# Patient Record
Sex: Female | Born: 1965
Health system: Southern US, Community
[De-identification: ages and names within clinical notes are randomized; demographics above are authoritative.]

## PROBLEM LIST (undated history)

## (undated) DIAGNOSIS — Z8719 Personal history of other diseases of the digestive system: Secondary | ICD-10-CM

## (undated) DIAGNOSIS — F988 Other specified behavioral and emotional disorders with onset usually occurring in childhood and adolescence: Secondary | ICD-10-CM

## (undated) DIAGNOSIS — S83511A Sprain of anterior cruciate ligament of right knee, initial encounter: Secondary | ICD-10-CM

## (undated) DIAGNOSIS — Z973 Presence of spectacles and contact lenses: Secondary | ICD-10-CM

## (undated) HISTORY — PX: WISDOM TOOTH EXTRACTION: SHX21

## (undated) HISTORY — DX: Other specified behavioral and emotional disorders with onset usually occurring in childhood and adolescence: F98.8

---

## 2004-09-30 ENCOUNTER — Ambulatory Visit (HOSPITAL_COMMUNITY): Admission: RE | Admit: 2004-09-30 | Discharge: 2004-09-30 | Payer: Self-pay | Admitting: Family Medicine

## 2006-04-10 ENCOUNTER — Other Ambulatory Visit: Admission: RE | Admit: 2006-04-10 | Discharge: 2006-04-10 | Payer: Self-pay | Admitting: Family Medicine

## 2006-08-18 HISTORY — PX: SHOULDER SURGERY: SHX246

## 2007-04-08 ENCOUNTER — Ambulatory Visit (HOSPITAL_COMMUNITY): Admission: RE | Admit: 2007-04-08 | Discharge: 2007-04-08 | Payer: Self-pay | Admitting: Specialist

## 2007-06-08 ENCOUNTER — Ambulatory Visit (HOSPITAL_BASED_OUTPATIENT_CLINIC_OR_DEPARTMENT_OTHER): Admission: RE | Admit: 2007-06-08 | Discharge: 2007-06-08 | Payer: Self-pay | Admitting: Specialist

## 2007-06-08 HISTORY — PX: SHOULDER ARTHROSCOPY W/ LABRAL REPAIR: SHX2399

## 2007-07-26 ENCOUNTER — Encounter: Admission: RE | Admit: 2007-07-26 | Discharge: 2007-08-18 | Payer: Self-pay | Admitting: Specialist

## 2007-08-19 ENCOUNTER — Encounter: Admission: RE | Admit: 2007-08-19 | Discharge: 2007-09-20 | Payer: Self-pay | Admitting: Specialist

## 2009-05-10 ENCOUNTER — Emergency Department (HOSPITAL_COMMUNITY): Admission: EM | Admit: 2009-05-10 | Discharge: 2009-05-10 | Payer: Self-pay | Admitting: Family Medicine

## 2009-06-25 ENCOUNTER — Other Ambulatory Visit: Admission: RE | Admit: 2009-06-25 | Discharge: 2009-06-25 | Payer: Self-pay | Admitting: Family Medicine

## 2010-07-09 ENCOUNTER — Ambulatory Visit (HOSPITAL_COMMUNITY)
Admission: RE | Admit: 2010-07-09 | Discharge: 2010-07-09 | Payer: Self-pay | Source: Home / Self Care | Admitting: Family Medicine

## 2010-08-02 ENCOUNTER — Encounter
Admission: RE | Admit: 2010-08-02 | Discharge: 2010-08-02 | Payer: Self-pay | Source: Home / Self Care | Attending: Family Medicine | Admitting: Family Medicine

## 2010-09-08 ENCOUNTER — Encounter: Payer: Self-pay | Admitting: Family Medicine

## 2010-12-31 NOTE — Op Note (Signed)
Melissa Mejia, Melissa Mejia                 ACCOUNT NO.:  [id number]   MEDICAL RECORD NO.:  [id number]          PATIENT TYPE:  AMB   LOCATION:  [facility]                         FACILITY:  [facility]   PHYSICIAN:  Erasmo Leventhal, M.D.DATE OF BIRTH:  12/16/1965   DATE OF PROCEDURE:  06/08/2007  DATE OF DISCHARGE:                               OPERATIVE REPORT   PREOPERATIVE DIAGNOSIS:  Left shoulder posterior labral tear.   POSTOPERATIVE DIAGNOSIS:  Left shoulder posterior labral tear.   PROCEDURE:  Left shoulder arthroscopic posterior labral repair.   SURGEON:  Erasmo Leventhal, [name]   ASSISTANT:  Jaquelyn Bitter. Chabon, P.A.-C.   ANESTHESIA:  Interscalene block with general.   BLOOD LOSS:  Less than 10 mL.   DRAINS:  None.   COMPLICATIONS:  None.   DISPOSITION:  PACU, stable.   DESCRIPTION OF PROCEDURE:  The patient was counseled in the holding  area.  The correct site was identified.  IV was started, antibiotics  given, and block was administered.  She was then taken to the operating  room and placed in the supine position under general anesthesia.  She  was turned to right lateral decubitus position properly padded and bump.   The left shoulder was examined, having revealed full range of motion and  stable.  She was then prepped with DuraPrep, draped in sterile fashion.  Overhead shoulder positioner was utilized at 30 degrees abduction, 10  degrees forward flexion, and 10 pounds of longitudinal traction.  A  posterior portal was created and arthroscope placed into the  glenohumeral joint.  Diagnostic arthroscopy revealed normal anterior and  superior structures, including the synovium, capsule, rotator cuff,  biceps superior, anterior, and inferior labrum.  The articular cartilage  was normal.  Anterior, superior, middle, and inferior glenohumeral  ligaments were normal.  The shoulder was stable.  Posteriorly, there was  an obvious labral tear.  An anterior portal was created and  the  arthroscope placed anteriorly.  Looking posteriorly, a posterior portal  was then utilized also.  She was found to have an area of grade 2  chondromalacia, small area 5 x 5 mm on the posterior superior glenoid,  and there was obvious detachment of the posterior labrum but no evidence  of instability.  The shaver was introduced.  Some of the ragged edges  were shaved down and smoothed nicely.  The repair site was repaired down  to bleeding bone with a rasp.  FiberWire mattress sutures were then  placed into the posterior capsule and labrum, and then a drill hole was  performed, and a push lock anchor was utilized for mattress type repair  of the posterior inferior labrum.  In the mid-superior aspect, another  mattress suture was placed, push drill hole, push lock anchor.  When we  had two nice mattress sutures in here, the shoulder was stable.  The  labrum had been nicely repaired.  There were no complications or  problems.  A light mechanical  complex also performed on the  chondromalacia.  The shoulder joint was copiously irrigated.  No  abnormalities were  noted in the  subacromial region.  She was taken out  of traction.  The portals were closed with 4-0 nylon suture, and 10 mL  of 0.5% Marcaine with epinephrine placed in the portal sites and  shoulder joint.  Sterile dressing was applied.   She was then turned supine, awakened, and taken to the recovery room in  stable condition.  Sponge and needle counts were correct.  No  complications or problems.  Another gram of Ancef was given  intravenously at the end of the case.   SURGICAL DECISION MAKING AND TECHNIQUE:  Mr. Leilani Able, P.A.-C.  assisted in the entire case.  The patient will have stabilization in the  PACU and discharged home.           ______________________________  Erasmo Leventhal, [name]     RAC/MEDQ  D:  06/08/2007  T:  06/08/2007  Job:  [id number]

## 2011-05-28 LAB — POCT PREGNANCY, URINE: Operator id: 268271

## 2013-05-04 ENCOUNTER — Other Ambulatory Visit: Payer: Self-pay | Admitting: Family Medicine

## 2013-05-04 ENCOUNTER — Other Ambulatory Visit (HOSPITAL_COMMUNITY)
Admission: RE | Admit: 2013-05-04 | Discharge: 2013-05-04 | Disposition: A | Discharge status: Home / Self Care | Payer: 59 | Source: Ambulatory Visit | Attending: Family Medicine | Admitting: Family Medicine

## 2013-05-04 DIAGNOSIS — Z0000 Encounter for general adult medical examination without abnormal findings: Secondary | ICD-10-CM | POA: Insufficient documentation

## 2014-01-02 ENCOUNTER — Ambulatory Visit (INDEPENDENT_AMBULATORY_CARE_PROVIDER_SITE_OTHER): Payer: 59 | Admitting: Family Medicine

## 2014-01-02 ENCOUNTER — Encounter (INDEPENDENT_AMBULATORY_CARE_PROVIDER_SITE_OTHER): Payer: Self-pay

## 2014-01-02 ENCOUNTER — Encounter: Payer: Self-pay | Admitting: Family Medicine

## 2014-01-02 VITALS — BP 116/81 | HR 76 | Ht 65.0 in | Wt 160.0 lb

## 2014-01-02 DIAGNOSIS — S99929A Unspecified injury of unspecified foot, initial encounter: Secondary | ICD-10-CM

## 2014-01-02 DIAGNOSIS — S99919A Unspecified injury of unspecified ankle, initial encounter: Secondary | ICD-10-CM

## 2014-01-02 DIAGNOSIS — M25569 Pain in unspecified knee: Secondary | ICD-10-CM

## 2014-01-02 DIAGNOSIS — S8990XA Unspecified injury of unspecified lower leg, initial encounter: Secondary | ICD-10-CM

## 2014-01-02 DIAGNOSIS — M25562 Pain in left knee: Secondary | ICD-10-CM

## 2014-01-02 DIAGNOSIS — S8992XA Unspecified injury of left lower leg, initial encounter: Secondary | ICD-10-CM

## 2014-01-03 ENCOUNTER — Encounter: Payer: Self-pay | Admitting: Family Medicine

## 2014-01-03 ENCOUNTER — Ambulatory Visit (HOSPITAL_BASED_OUTPATIENT_CLINIC_OR_DEPARTMENT_OTHER)
Admission: RE | Admit: 2014-01-03 | Discharge: 2014-01-03 | Disposition: A | Discharge status: Home / Self Care | Payer: 59 | Source: Ambulatory Visit | Attending: Family Medicine | Admitting: Family Medicine

## 2014-01-03 DIAGNOSIS — M25562 Pain in left knee: Secondary | ICD-10-CM

## 2014-01-03 DIAGNOSIS — M25569 Pain in unspecified knee: Secondary | ICD-10-CM | POA: Insufficient documentation

## 2014-01-03 DIAGNOSIS — X58XXXA Exposure to other specified factors, initial encounter: Secondary | ICD-10-CM | POA: Insufficient documentation

## 2014-01-03 DIAGNOSIS — S83509A Sprain of unspecified cruciate ligament of unspecified knee, initial encounter: Secondary | ICD-10-CM | POA: Insufficient documentation

## 2014-01-03 DIAGNOSIS — IMO0002 Reserved for concepts with insufficient information to code with codable children: Secondary | ICD-10-CM | POA: Insufficient documentation

## 2014-01-03 DIAGNOSIS — S8992XA Unspecified injury of left lower leg, initial encounter: Secondary | ICD-10-CM | POA: Insufficient documentation

## 2014-01-03 NOTE — Progress Notes (Addendum)
Patient ID: Melissa Mejia, female   DOB: Dec 20, 1965, 48 y.o.   MRN: [id number]  PCP: Vidal Schwalbe, MD  Subjective:   HPI: Patient is a 48 y.o. female here for left knee injury.  Patient was doing a mud run this weekend. She states on 5/16 she was trying to climb an 8 foot wall as part of this and she fell backwards, planted left foot. Tried going up again, fell back again but twisted knee - did not hear a pop but was very uncomfortable. Continued with the race but knee almost gave out once. Saturday night using stairs also had feeling knee was going to give out. No catching, locking. + swelling. Prior MCL sprain a year ago to this knee.  History reviewed. No pertinent past medical history.  No current outpatient prescriptions on file prior to visit.   No current facility-administered medications on file prior to visit.    Past Surgical History  Procedure Laterality Date  . Shoulder surgery Left 2008    labrum repair    No Known Allergies  History   Social History  . Marital Status: Married    Spouse Name: N/A    Number of Children: N/A  . Years of Education: N/A   Occupational History  . Not on file.   Social History Main Topics  . Smoking status: Former Research scientist (life sciences)  . Smokeless tobacco: Not on file  . Alcohol Use: Not on file  . Drug Use: Not on file  . Sexual Activity: Not on file   Other Topics Concern  . Not on file   Social History Narrative  . No narrative on file    History reviewed. No pertinent family history.  BP 116/81  Pulse 76  Ht 5\' 5"  (1.651 m)  Wt 160 lb (72.576 kg)  BMI 26.63 kg/m2  Review of Systems: See HPI above.    Objective:  Physical Exam:  Gen: NAD  Left knee: Mild-mod effusion.  No other deformity, bruising. No TTP. ROM 0 - 110 degrees, tightness with full flexion. Positive anterior drawer, lachmanns.  Negative post drawers. Negative valgus/varus testing.  Negative mcmurrays, apleys, patellar apprehension. NV intact  distally.    Assessment & Plan:  1. Left knee injury - consistent with ACL tear.  Meniscus testing negative however.  Will go ahead with MRI to further assess.  Addendum:  MRI reviewed and discussed with patient.  She does have a complete ACL tear as expected.  No meniscal tear or other associated injuries.  She will see Dr. Theda Sers (ortho) to discuss ACL reconstruction.  Follow up with me prn.

## 2014-01-03 NOTE — Assessment & Plan Note (Signed)
consistent with ACL tear.  Meniscus testing negative however.  Will go ahead with MRI to further assess.

## 2014-02-03 ENCOUNTER — Encounter (HOSPITAL_BASED_OUTPATIENT_CLINIC_OR_DEPARTMENT_OTHER): Payer: Self-pay | Admitting: *Deleted

## 2014-02-06 ENCOUNTER — Other Ambulatory Visit: Payer: Self-pay | Admitting: Orthopedic Surgery

## 2014-02-09 ENCOUNTER — Ambulatory Visit (HOSPITAL_BASED_OUTPATIENT_CLINIC_OR_DEPARTMENT_OTHER): Payer: 59 | Admitting: Anesthesiology

## 2014-02-09 ENCOUNTER — Encounter (HOSPITAL_BASED_OUTPATIENT_CLINIC_OR_DEPARTMENT_OTHER): Payer: Self-pay | Admitting: *Deleted

## 2014-02-09 ENCOUNTER — Ambulatory Visit (HOSPITAL_BASED_OUTPATIENT_CLINIC_OR_DEPARTMENT_OTHER)
Admission: RE | Admit: 2014-02-09 | Discharge: 2014-02-09 | Disposition: A | Discharge status: Home / Self Care | Payer: 59 | Source: Ambulatory Visit | Attending: Specialist | Admitting: Specialist

## 2014-02-09 ENCOUNTER — Encounter (HOSPITAL_BASED_OUTPATIENT_CLINIC_OR_DEPARTMENT_OTHER): Payer: 59 | Admitting: Anesthesiology

## 2014-02-09 ENCOUNTER — Encounter (HOSPITAL_BASED_OUTPATIENT_CLINIC_OR_DEPARTMENT_OTHER)
Admission: RE | Disposition: A | Discharge status: Home / Self Care | Payer: Self-pay | Source: Ambulatory Visit | Attending: Specialist

## 2014-02-09 DIAGNOSIS — S83509A Sprain of unspecified cruciate ligament of unspecified knee, initial encounter: Secondary | ICD-10-CM | POA: Insufficient documentation

## 2014-02-09 DIAGNOSIS — Z9889 Other specified postprocedural states: Secondary | ICD-10-CM

## 2014-02-09 DIAGNOSIS — Z87891 Personal history of nicotine dependence: Secondary | ICD-10-CM | POA: Insufficient documentation

## 2014-02-09 DIAGNOSIS — W138XXA Fall from, out of or through other building or structure, initial encounter: Secondary | ICD-10-CM | POA: Insufficient documentation

## 2014-02-09 DIAGNOSIS — Z79899 Other long term (current) drug therapy: Secondary | ICD-10-CM | POA: Insufficient documentation

## 2014-02-09 HISTORY — PX: KNEE ARTHROSCOPY WITH ANTERIOR CRUCIATE LIGAMENT (ACL) REPAIR WITH HAMSTRING GRAFT: SHX5645

## 2014-02-09 HISTORY — DX: Personal history of other diseases of the digestive system: Z87.19

## 2014-02-09 HISTORY — DX: Presence of spectacles and contact lenses: Z97.3

## 2014-02-09 HISTORY — DX: Sprain of anterior cruciate ligament of right knee, initial encounter: S83.511A

## 2014-02-09 LAB — POCT PREGNANCY, URINE: Preg Test, Ur: NEGATIVE

## 2014-02-09 LAB — POCT HEMOGLOBIN-HEMACUE: HEMOGLOBIN: 12.6 g/dL (ref 12.0–15.0)

## 2014-02-09 SURGERY — KNEE ARTHROSCOPY WITH ANTERIOR CRUCIATE LIGAMENT (ACL) REPAIR WITH HAMSTRING GRAFT
Anesthesia: General | Site: Knee | Laterality: Left

## 2014-02-09 MED ORDER — MIDAZOLAM HCL 5 MG/5ML IJ SOLN
INTRAMUSCULAR | Status: DC | PRN
  Administered 2014-02-09: 2 mg via INTRAVENOUS

## 2014-02-09 MED ORDER — CEPHALEXIN 500 MG PO CAPS: 500.0000 mg | ORAL_CAPSULE | Freq: Three times a day (TID) | ORAL | Status: DC

## 2014-02-09 MED ORDER — ONDANSETRON HCL 4 MG/2ML IJ SOLN
INTRAMUSCULAR | Status: DC | PRN
  Administered 2014-02-09: 4 mg via INTRAVENOUS

## 2014-02-09 MED ORDER — MIDAZOLAM HCL 2 MG/2ML IJ SOLN
INTRAMUSCULAR | Status: AC
  Filled 2014-02-09: qty 2

## 2014-02-09 MED ORDER — FENTANYL CITRATE 0.05 MG/ML IJ SOLN
INTRAMUSCULAR | Status: DC | PRN
  Administered 2014-02-09 (×4): 50 ug via INTRAVENOUS

## 2014-02-09 MED ORDER — OXYCODONE HCL 5 MG/5ML PO SOLN
5.0000 mg | Freq: Once | ORAL | Status: AC | PRN
  Filled 2014-02-09: qty 5

## 2014-02-09 MED ORDER — HYDROMORPHONE HCL PF 1 MG/ML IJ SOLN
INTRAMUSCULAR | Status: AC
  Filled 2014-02-09: qty 1

## 2014-02-09 MED ORDER — PROMETHAZINE HCL 25 MG/ML IJ SOLN
6.2500 mg | INTRAMUSCULAR | Status: DC | PRN
  Filled 2014-02-09: qty 1

## 2014-02-09 MED ORDER — METHOCARBAMOL 500 MG PO TABS: 500.0000 mg | ORAL_TABLET | Freq: Four times a day (QID) | ORAL | Status: DC | PRN

## 2014-02-09 MED ORDER — CEFAZOLIN SODIUM-DEXTROSE 2-3 GM-% IV SOLR
2.0000 g | INTRAVENOUS | Status: AC
  Administered 2014-02-09: 2 g via INTRAVENOUS
  Filled 2014-02-09: qty 50

## 2014-02-09 MED ORDER — FENTANYL CITRATE 0.05 MG/ML IJ SOLN
INTRAMUSCULAR | Status: AC
  Filled 2014-02-09: qty 2

## 2014-02-09 MED ORDER — POVIDONE-IODINE 7.5 % EX SOLN
Freq: Once | CUTANEOUS | Status: DC
  Filled 2014-02-09: qty 118

## 2014-02-09 MED ORDER — SODIUM CHLORIDE 0.9 % IR SOLN
Status: DC | PRN
  Administered 2014-02-09: 18000 mL

## 2014-02-09 MED ORDER — ASPIRIN EC 325 MG PO TBEC: 325.0000 mg | DELAYED_RELEASE_TABLET | Freq: Two times a day (BID) | ORAL | Status: DC

## 2014-02-09 MED ORDER — FENTANYL CITRATE 0.05 MG/ML IJ SOLN
100.0000 ug | Freq: Once | INTRAMUSCULAR | Status: AC
  Administered 2014-02-09: 100 ug via INTRAVENOUS
  Filled 2014-02-09: qty 2

## 2014-02-09 MED ORDER — ACETAMINOPHEN 10 MG/ML IV SOLN
INTRAVENOUS | Status: DC | PRN
  Administered 2014-02-09: 1000 mg via INTRAVENOUS

## 2014-02-09 MED ORDER — OXYCODONE HCL 5 MG PO TABS
5.0000 mg | ORAL_TABLET | Freq: Once | ORAL | Status: AC | PRN
  Administered 2014-02-09: 5 mg via ORAL
  Filled 2014-02-09: qty 1

## 2014-02-09 MED ORDER — MIDAZOLAM HCL 2 MG/2ML IJ SOLN
2.0000 mg | Freq: Once | INTRAMUSCULAR | Status: AC
  Administered 2014-02-09: 2 mg via INTRAVENOUS
  Filled 2014-02-09: qty 2

## 2014-02-09 MED ORDER — SCOPOLAMINE 1 MG/3DAYS TD PT72
MEDICATED_PATCH | TRANSDERMAL | Status: DC | PRN
  Administered 2014-02-09: 1 via TRANSDERMAL

## 2014-02-09 MED ORDER — DEXAMETHASONE SODIUM PHOSPHATE 4 MG/ML IJ SOLN
INTRAMUSCULAR | Status: DC | PRN
  Administered 2014-02-09: 10 mg via INTRAVENOUS

## 2014-02-09 MED ORDER — BUPIVACAINE HCL (PF) 0.25 % IJ SOLN
INTRAMUSCULAR | Status: DC | PRN
  Administered 2014-02-09: 30 mL

## 2014-02-09 MED ORDER — MORPHINE SULFATE 4 MG/ML IJ SOLN
INTRAMUSCULAR | Status: AC
  Filled 2014-02-09: qty 1

## 2014-02-09 MED ORDER — LIDOCAINE HCL (CARDIAC) 20 MG/ML IV SOLN
INTRAVENOUS | Status: DC | PRN
  Administered 2014-02-09: 70 mg via INTRAVENOUS

## 2014-02-09 MED ORDER — OXYCODONE HCL 5 MG PO TABS
ORAL_TABLET | ORAL | Status: AC
  Filled 2014-02-09: qty 1

## 2014-02-09 MED ORDER — OXYCODONE-ACETAMINOPHEN 5-325 MG PO TABS: 1.0000 | ORAL_TABLET | ORAL | Status: DC | PRN

## 2014-02-09 MED ORDER — FENTANYL CITRATE 0.05 MG/ML IJ SOLN
INTRAMUSCULAR | Status: AC
  Filled 2014-02-09: qty 4

## 2014-02-09 MED ORDER — ROPIVACAINE HCL 5 MG/ML IJ SOLN
INTRAMUSCULAR | Status: DC | PRN
  Administered 2014-02-09: 30 mL via PERINEURAL

## 2014-02-09 MED ORDER — LACTATED RINGERS IV SOLN
INTRAVENOUS | Status: DC | PRN
  Administered 2014-02-09 (×2): via INTRAVENOUS

## 2014-02-09 MED ORDER — MEPERIDINE HCL 25 MG/ML IJ SOLN
6.2500 mg | INTRAMUSCULAR | Status: DC | PRN
  Filled 2014-02-09: qty 1

## 2014-02-09 MED ORDER — HYDROMORPHONE HCL PF 1 MG/ML IJ SOLN
0.2500 mg | INTRAMUSCULAR | Status: DC | PRN
  Administered 2014-02-09: 0.25 mg via INTRAVENOUS
  Filled 2014-02-09: qty 1

## 2014-02-09 MED ORDER — MORPHINE SULFATE 4 MG/ML IJ SOLN
INTRAMUSCULAR | Status: DC | PRN
  Administered 2014-02-09: 4 mg via INTRAMUSCULAR

## 2014-02-09 MED ORDER — SODIUM CHLORIDE 0.9 % IV SOLN
INTRAVENOUS | Status: DC
  Filled 2014-02-09: qty 1000

## 2014-02-09 MED ORDER — LACTATED RINGERS IV SOLN
INTRAVENOUS | Status: DC
  Administered 2014-02-09 (×2): via INTRAVENOUS
  Filled 2014-02-09: qty 1000

## 2014-02-09 MED ORDER — PROPOFOL 10 MG/ML IV BOLUS
INTRAVENOUS | Status: DC | PRN
  Administered 2014-02-09: 150 mg via INTRAVENOUS

## 2014-02-09 SURGICAL SUPPLY — 85 items
ANCHOR BUTTON TIGHTROPE BTB (Anchor) ×3 IMPLANT
ANCHOR BUTTON TIGHTROPE RN 14 (Anchor) ×3 IMPLANT
ANCHOR PUSHLOCK BIOCOMP 3.5X19 (Orthopedic Implant) ×3 IMPLANT
BANDAGE ESMARK 6X9 LF (GAUZE/BANDAGES/DRESSINGS) ×1 IMPLANT
BLADE 4.2CUDA (BLADE) IMPLANT
BLADE CUDA 5.5 (BLADE) ×3 IMPLANT
BLADE CUDA GRT WHITE 3.5 (BLADE) IMPLANT
BLADE SURG 10 STRL SS (BLADE) ×3 IMPLANT
BLADE SURG 15 STRL LF DISP TIS (BLADE) ×1 IMPLANT
BLADE SURG 15 STRL SS (BLADE) ×2
BNDG ESMARK 6X9 LF (GAUZE/BANDAGES/DRESSINGS) ×3
BNDG GAUZE ELAST 4 BULKY (GAUZE/BANDAGES/DRESSINGS) ×6 IMPLANT
BUR OVAL 6.0 (BURR) IMPLANT
BUR VERTEX HOODED 4.5 (BURR) ×3 IMPLANT
CANISTER SUCT LVC 12 LTR MEDI- (MISCELLANEOUS) ×9 IMPLANT
CANISTER SUCTION 1200CC (MISCELLANEOUS) IMPLANT
CANISTER SUCTION 2500CC (MISCELLANEOUS) ×6 IMPLANT
CLOSURE WOUND 1/2 X4 (GAUZE/BANDAGES/DRESSINGS) ×1
CLOTH BEACON ORANGE TIMEOUT ST (SAFETY) IMPLANT
COVER TABLE BACK 60X90 (DRAPES) ×3 IMPLANT
CUFF TOURN SGL QUICK 34 (TOURNIQUET CUFF) ×2
CUFF TRNQT CYL 34X4X40X1 (TOURNIQUET CUFF) ×1 IMPLANT
CUTTER FLIP II 9.5MM (INSTRUMENTS) ×3 IMPLANT
DRAPE ARTHROSCOPY W/POUCH 114 (DRAPES) ×3 IMPLANT
DRAPE INCISE IOBAN 66X45 STRL (DRAPES) ×3 IMPLANT
DRAPE LG THREE QUARTER DISP (DRAPES) ×9 IMPLANT
DRAPE OEC MINIVIEW 54X84 (DRAPES) ×3 IMPLANT
DRAPE POUCH INSTRU U-SHP 10X18 (DRAPES) ×3 IMPLANT
DRAPE U-SHAPE 47X51 STRL (DRAPES) ×3 IMPLANT
DURAPREP 26ML APPLICATOR (WOUND CARE) ×6 IMPLANT
ELECT REM PT RETURN 9FT ADLT (ELECTROSURGICAL) ×3
ELECTRODE REM PT RTRN 9FT ADLT (ELECTROSURGICAL) ×1 IMPLANT
FIBERSTICK 2 (SUTURE) ×3 IMPLANT
GAUZE XEROFORM 1X8 LF (GAUZE/BANDAGES/DRESSINGS) ×3 IMPLANT
GLOVE BIO SURGEON STRL SZ7.5 (GLOVE) ×3 IMPLANT
GLOVE BIOGEL PI IND STRL 7.5 (GLOVE) ×2 IMPLANT
GLOVE BIOGEL PI INDICATOR 7.5 (GLOVE) ×4
GLOVE INDICATOR 8.0 STRL GRN (GLOVE) ×6 IMPLANT
GLOVE SURG ORTHO 8.0 STRL STRW (GLOVE) ×3 IMPLANT
GLOVE SURG SS PI 7.5 STRL IVOR (GLOVE) ×3 IMPLANT
GOWN PREVENTION PLUS LG XLONG (DISPOSABLE) IMPLANT
GOWN STRL REIN XL XLG (GOWN DISPOSABLE) IMPLANT
GOWN STRL REUS W/TWL LRG LVL3 (GOWN DISPOSABLE) ×3 IMPLANT
GOWN STRL REUS W/TWL XL LVL3 (GOWN DISPOSABLE) ×9 IMPLANT
IMMOBILIZER KNEE 22 UNIV (SOFTGOODS) IMPLANT
IV NS IRRIG 3000ML ARTHROMATIC (IV SOLUTION) ×18 IMPLANT
KIT IMPLANT TIGHTROPE ABS OPEN (Anchor) ×3 IMPLANT
KIT TRANSTIBIAL (DISPOSABLE) IMPLANT
KNEE WRAP E Z 3 GEL PACK (MISCELLANEOUS) ×3 IMPLANT
MINI VAC (SURGICAL WAND) IMPLANT
NEEDLE HYPO 22GX1.5 SAFETY (NEEDLE) ×3 IMPLANT
NS IRRIG 1000ML POUR BTL (IV SOLUTION) ×3 IMPLANT
PACK ARTHROSCOPY DSU (CUSTOM PROCEDURE TRAY) ×3 IMPLANT
PACK BASIN DAY SURGERY FS (CUSTOM PROCEDURE TRAY) ×3 IMPLANT
PAD ABD 8X10 STRL (GAUZE/BANDAGES/DRESSINGS) ×6 IMPLANT
PADDING CAST ABS 4INX4YD NS (CAST SUPPLIES)
PADDING CAST ABS COTTON 4X4 ST (CAST SUPPLIES) IMPLANT
PENCIL BUTTON HOLSTER BLD 10FT (ELECTRODE) ×3 IMPLANT
SET ARTHROSCOPY TUBING (MISCELLANEOUS) ×2
SET ARTHROSCOPY TUBING LN (MISCELLANEOUS) ×1 IMPLANT
SET PAD KNEE POSITIONER (MISCELLANEOUS) ×3 IMPLANT
SPONGE GAUZE 4X4 12PLY (GAUZE/BANDAGES/DRESSINGS) ×6 IMPLANT
SPONGE LAP 4X18 X RAY DECT (DISPOSABLE) ×3 IMPLANT
STRIP CLOSURE SKIN 1/2X4 (GAUZE/BANDAGES/DRESSINGS) ×2 IMPLANT
SUCTION FRAZIER TIP 10 FR DISP (SUCTIONS) ×3 IMPLANT
SUT 2 FIBERLOOP 20 STRT BLUE (SUTURE) ×3
SUT ETHILON 4 0 PS 2 18 (SUTURE) ×6 IMPLANT
SUT FIBERWIRE #2 38 T-5 BLUE (SUTURE)
SUT MNCRL AB 3-0 PS2 18 (SUTURE) IMPLANT
SUT VIC AB 0 CT1 36 (SUTURE) ×9 IMPLANT
SUT VIC AB 0 CT2 27 (SUTURE) IMPLANT
SUT VIC AB 2-0 CT1 27 (SUTURE)
SUT VIC AB 2-0 CT1 TAPERPNT 27 (SUTURE) IMPLANT
SUTURE 2 FIBERLOOP 20 STRT BLU (SUTURE) ×1 IMPLANT
SUTURE FIBERWR #2 38 T-5 BLUE (SUTURE) IMPLANT
SUTURE TIGERSTICK 2 TIGERWIR 2 (MISCELLANEOUS) IMPLANT
SYR CONTROL 10ML LL (SYRINGE) ×3 IMPLANT
TIGERSTICK 2 TIGERWIRE 2 (MISCELLANEOUS)
TISSUE GRAFTLINK FGL (Tissue) ×3 IMPLANT
TOWEL OR 17X24 6PK STRL BLUE (TOWEL DISPOSABLE) ×9 IMPLANT
TUBE CONNECTING 12'X1/4 (SUCTIONS) ×2
TUBE CONNECTING 12X1/4 (SUCTIONS) ×4 IMPLANT
WAND 30 DEG SABER W/CORD (SURGICAL WAND) IMPLANT
WAND 90 DEG TURBOVAC W/CORD (SURGICAL WAND) IMPLANT
WATER STERILE IRR 500ML POUR (IV SOLUTION) ×3 IMPLANT

## 2014-02-09 NOTE — Anesthesia Preprocedure Evaluation (Addendum)
Anesthesia Evaluation  Patient identified by MRN, date of birth, ID band Patient awake    Reviewed: Allergy & Precautions, H&P , NPO status , Patient's Chart, lab work & pertinent test results  Airway Mallampati: II TM Distance: >3 FB Neck ROM: Full    Dental no notable dental hx.    Pulmonary neg pulmonary ROS, former smoker,  breath sounds clear to auscultation  Pulmonary exam normal       Cardiovascular Exercise Tolerance: Good negative cardio ROS  Rhythm:Regular Rate:Normal     Neuro/Psych negative neurological ROS  negative psych ROS   GI/Hepatic negative GI ROS, Neg liver ROS,   Endo/Other  negative endocrine ROS  Renal/GU negative Renal ROS  negative genitourinary   Musculoskeletal negative musculoskeletal ROS (+)   Abdominal   Peds negative pediatric ROS (+)  Hematology negative hematology ROS (+)   Anesthesia Other Findings   Reproductive/Obstetrics negative OB ROS                          Anesthesia Physical Anesthesia Plan  ASA: II  Anesthesia Plan: General   Post-op Pain Management:    Induction: Intravenous  Airway Management Planned: LMA  Additional Equipment:   Intra-op Plan:   Post-operative Plan: Extubation in OR  Informed Consent: I have reviewed the patients History and Physical, chart, labs and discussed the procedure including the risks, benefits and alternatives for the proposed anesthesia with the patient or authorized representative who has indicated his/her understanding and acceptance.   Dental advisory given  Plan Discussed with: CRNA and Anesthesiologist  Anesthesia Plan Comments:        Anesthesia Quick Evaluation

## 2014-02-09 NOTE — Anesthesia Procedure Notes (Addendum)
Anesthesia Regional Block:  Femoral nerve block  Pre-Anesthetic Checklist: ,, timeout performed, Correct Patient, Correct Site, Correct Laterality, Correct Procedure, Correct Position, site marked, Risks and benefits discussed,  Surgical consent,  Pre-op evaluation,  At surgeon's request and post-op pain management  Laterality: Left  Prep: chloraprep       Needles:  Injection technique: Single-shot  Needle Type: Stimulator Needle - 80     Needle Length: 9cm 9 cm Needle Gauge: 22 and 22 G  Needle insertion depth: 6 cm   Additional Needles:  Procedures: ultrasound guided (picture in chart) and nerve stimulator Femoral nerve block  Nerve Stimulator or Paresthesia:  Response: Twitch elicited, 0.6 mA,   Additional Responses:   Narrative:  Injection made incrementally with aspirations every 5 mL.  Performed by: Personally  Anesthesiologist: Lissa Hoard, MD  Additional Notes: BP cuff, EKG monitors applied. Sedation begun. Femoral artery palpated for location of nerve. After nerve location via u/s and anesthetic injected incrementally, slowly , and after neg aspirations. Tolerated well.   Procedure Name: LMA Insertion Date/Time: 02/09/2014 3:35 PM Performed by: Wanita Chamberlain Pre-anesthesia Checklist: Patient identified, Timeout performed, Emergency Drugs available, Suction available and Patient being monitored Oxygen Delivery Method: Circle system utilized Preoxygenation: Pre-oxygenation with 100% oxygen Intubation Type: IV induction Ventilation: Mask ventilation without difficulty LMA: LMA inserted LMA Size: 4.0 Number of attempts: 1 Placement Confirmation: breath sounds checked- equal and bilateral and positive ETCO2 Tube secured with: Tape

## 2014-02-09 NOTE — H&P (Signed)
Melissa Mejia is an 48 y.o. female.   Chief Complaint: Left knee pain and instabilty for a month HPI: Patient presents with left knee discomfort that had been persistent for a month now. Related to falling from a 8 foot wall. Despite conservative treatments, her discomfort has not improved. Imaging was obtained. Other conservative and surgical treatments were discussed in detail. Patient wishes to proceed with surgery as consented. Denies SOB, CP, or calf pain. No Fever, chills, or nausea/ vomiting.   Past Medical History  Diagnosis Date  . Right ACL tear   . History of esophageal reflux   . Wears contact lenses     Past Surgical History  Procedure Laterality Date  . Shoulder surgery Left 2008    labrum repair  . Wisdom tooth extraction      History reviewed. No pertinent family history. Social History:  reports that she quit smoking about 21 years ago. Her smoking use included Cigarettes. She smoked 0.00 packs per day for 4 years. She has never used smokeless tobacco. She reports that she drinks about 1.8 ounces of alcohol per week. She reports that she does not use illicit drugs.  Allergies: No Known Allergies  Medications Prior to Admission  Medication Sig Dispense Refill  . Green Tea, Camillia sinensis, (GREEN TEA EXTRACT PO) Take by mouth.      . naproxen sodium (ANAPROX) 220 MG tablet Take 220 mg by mouth as needed.      . Omega-3 Fatty Acids (FISH OIL PO) Take by mouth.        Results for orders placed during the hospital encounter of 02/09/14 (from the past 48 hour(s))  POCT HEMOGLOBIN-HEMACUE     Status: None   Collection Time    02/09/14 11:43 AM      Result Value Ref Range   Hemoglobin 12.6  12.0 - 15.0 g/dL  POCT PREGNANCY, URINE     Status: None   Collection Time    02/09/14 12:15 PM      Result Value Ref Range   Preg Test, Ur NEGATIVE  NEGATIVE   Comment:            THE SENSITIVITY OF THIS     METHODOLOGY IS >24 mIU/mL   No results found.  Review of  Systems  Constitutional: Negative.   HENT: Negative.   Eyes: Negative.   Respiratory: Negative.   Cardiovascular: Negative.   Gastrointestinal: Negative.   Genitourinary: Negative.   Musculoskeletal: Positive for joint pain.  Skin: Negative.   Neurological: Negative.   Endo/Heme/Allergies: Negative.   Psychiatric/Behavioral: Negative.     Blood pressure 118/84, pulse 64, temperature 98.6 F (37 C), temperature source Oral, resp. rate 15, height 5\' 5"  (1.651 m), weight 73.029 kg (161 lb), last menstrual period 02/02/2014, SpO2 99.00%. Physical Exam  Constitutional: She is oriented to person, place, and time. She appears well-developed.  HENT:  Head: Normocephalic.  Eyes: EOM are normal.  Neck: Normal range of motion.  Cardiovascular: Normal rate, regular rhythm, normal heart sounds and intact distal pulses.   Respiratory: Effort normal and breath sounds normal.  GI: Soft. Bowel sounds are normal.  Genitourinary:  deferred  Musculoskeletal: She exhibits edema.  Left knee instabilty  Neurological: She is alert and oriented to person, place, and time.  Skin: Skin is warm and dry.  Psychiatric: Her behavior is normal.     Assessment/Plan Left ACL tear and possible mensicus tear: Left ACL reconstruction allograft and possible menisectomy D/c home today F/u  in office in 3-4 days Follow D/c instructions Take medications as directed  STILWELL, BRYSON L 02/09/2014, 3:12 PM

## 2014-02-09 NOTE — Discharge Instructions (Signed)
°  Post Anesthesia Home Care Instructions  Activity: Get plenty of rest for the remainder of the day. A responsible adult should stay with you for 24 hours following the procedure.  For the next 24 hours, DO NOT: -Drive a car -Paediatric nurse -Drink alcoholic beverages -Take any medication unless instructed by your physician -Make any legal decisions or sign important papers.  Meals: Start with liquid foods such as gelatin or soup. Progress to regular foods as tolerated. Avoid greasy, spicy, heavy foods. If nausea and/or vomiting occur, drink only clear liquids until the nausea and/or vomiting subsides. Call your physician if vomiting continues.  Special Instructions/Symptoms: Your throat may feel dry or sore from the anesthesia or the breathing tube placed in your throat during surgery. If this causes discomfort, gargle with warm salt water. The discomfort should disappear within 24 hours.  Post Anesthesia Home Care Instructions  Activity: Get plenty of rest for the remainder of the day. A responsible adult should stay with you for 24 hours following the procedure.  For the next 24 hours, DO NOT: -Drive a car -Paediatric nurse -Drink alcoholic beverages -Take any medication unless instructed by your physician -Make any legal decisions or sign important papers.  Meals: Start with liquid foods such as gelatin or soup. Progress to regular foods as tolerated. Avoid greasy, spicy, heavy foods. If nausea and/or vomiting occur, drink only clear liquids until the nausea and/or vomiting subsides. Call your physician if vomiting continues.  Special Instructions/Symptoms: Your throat may feel dry or sore from the anesthesia or the breathing tube placed in your throat during surgery. If this causes discomfort, gargle with warm salt water. The discomfort should disappear within 24 hours. Regional Anesthesia Blocks  1. Numbness or the inability to move the "blocked" extremity may last from  3-48 hours after placement. The length of time depends on the medication injected and your individual response to the medication. If the numbness is not going away after 48 hours, call your surgeon.  2. The extremity that is blocked will need to be protected until the numbness is gone and the  Strength has returned. Because you cannot feel it, you will need to take extra care to avoid injury. Because it may be weak, you may have difficulty moving it or using it. You may not know what position it is in without looking at it while the block is in effect.  3. For blocks in the legs and feet, returning to weight bearing and walking needs to be done carefully. You will need to wait until the numbness is entirely gone and the strength has returned. You should be able to move your leg and foot normally before you try and bear weight or walk. You will need someone to be with you when you first try to ensure you do not fall and possibly risk injury.  4. Bruising and tenderness at the needle site are common side effects and will resolve in a few days.  5. Persistent numbness or new problems with movement should be communicated to the surgeon or the [place] [phone] [place] [phone]).

## 2014-02-09 NOTE — Op Note (Signed)
MMNOTRRN#[id number]

## 2014-02-09 NOTE — Transfer of Care (Signed)
Immediate Anesthesia Transfer of Care Note  Patient: Melissa Mejia  Procedure(s) Performed: Procedure(s): LEFT KNEE ARTHROSCOPY WITH ALLOGRAFT ANTERIOR CRUCIATE LIGAMENT (ACL) HAMSTRING RECONSTRUCTION POSSIBLE  MENISCECTOMY  (Left)  Patient Location: PACU  Anesthesia Type:General  Level of Consciousness: awake, alert , oriented and patient cooperative  Airway & Oxygen Therapy: Patient Spontanous Breathing and Patient connected to nasal cannula oxygen  Post-op Assessment: Report given to PACU RN and Post -op Vital signs reviewed and stable  Post vital signs: Reviewed and stable  Complications: No apparent anesthesia complications

## 2014-02-09 NOTE — Anesthesia Postprocedure Evaluation (Signed)
  Anesthesia Post-op Note  Patient: Melissa Mejia  Procedure(s) Performed: Procedure(s) (LRB): LEFT KNEE ARTHROSCOPY WITH ALLOGRAFT ANTERIOR CRUCIATE LIGAMENT (ACL) HAMSTRING RECONSTRUCTION  (Left)  Patient Location: PACU  Anesthesia Type: GA combined with regional for post-op pain  Level of Consciousness: awake and alert   Airway and Oxygen Therapy: Patient Spontanous Breathing  Post-op Pain: mild  Post-op Assessment: Post-op Vital signs reviewed, Patient's Cardiovascular Status Stable, Respiratory Function Stable, Patent Airway and No signs of Nausea or vomiting  Last Vitals:  Filed Vitals:   02/09/14 1800  BP: 118/80  Pulse: 58  Temp:   Resp: 11    Post-op Vital Signs: stable   Complications: No apparent anesthesia complications

## 2014-02-10 NOTE — Op Note (Signed)
Melissa Mejia, Melissa Mejia NO.:  [id number]  MEDICAL RECORD NO.:  [id number]  LOCATION:                                 FACILITY:  PHYSICIAN:  [name], M.D.DATE OF BIRTH:  November 19, 1965  DATE OF PROCEDURE:  02/09/2014 DATE OF DISCHARGE:  02/09/2014                              OPERATIVE REPORT   PREOPERATIVE DIAGNOSIS:  Left knee torn anterior cruciate ligament, possible torn meniscus.  POSTOPERATIVE DIAGNOSES: 1. Left knee complete rupture of anterior cruciate ligament. 2. Chondral defect, medial femoral condyle, 1.5 x 1 cm, grade 3.  PROCEDURES: 1. Left knee arthroscopic-assisted allograft anterior cruciate     ligament reconstruction. 2. Chondroplasty, medial femoral condyle.  SURGEON:  [name], [name]  ASSISTANT:  Wyatt Portela, PA-C.  ANESTHESIA:  Regional with general.  ESTIMATED BLOOD LOSS:  Minimal.  DRAINS:  None.  COMPLICATIONS:  None.  TOURNIQUET TIME:  65 minutes at 250 mmHg.  DISPOSITION:  PACU, stable.  OPERATIVE DETAILS:  The patient was counseled in the holding area. Correct site was identified.  IV had been started.  IV sedation was given and block was administered.  Correct site was marked.  TED hose was applied on uninvolved leg.  On the way to the operating room, IV Ancef was given.  In the OR, placed in supine position under general anesthesia.  All extremities were well padded and bumped.  Left lower extremity was elevated.  Procedure was prepped with DuraPrep, and draped in a sterile fashion.  A time-out was done and confirmed the left lower extremity.  Exsanguinated with an Esmarch.  Tourniquet was inflated to 250 mmHg.  Arthroscopic portals were established; posteromedial, inferomedial, and inferolateral.  Diagnostic arthroscopy revealed complete rupture of the anterior cruciate ligament off the femoral attachment.  Used motorized shaver, notchplasty was performed, and PCL was protected.  An 80 size  was appropriate, likely an Arthrex allograft had been chosen, brought to the back table per Mr. Wyatt Portela, PA- C.  Patellofemoral joint with normal tracking.  Lateral compartment was __________ lateral meniscus.  Medial side was inspected.  The medial meniscal was intact.  Medial tibial plateau unremarkable with the medial femoral condyle, showed a 1.5 x 1 cm area of grade 3 chondromalacia, unstable for __________.  Mechanical chondroplasty was performed with a shaver and back to stable base.  The Arthrex __________ to the overtop position.  Small stab would was made laterally and Arthrex FlipCutter was placed to the anatomic ACL footprint and retrosocket was performed. FiberWire suture was passed in place.  Tunnel was rasped and debrided. On incision site, the guide was put into the anatomic ACL footprint on the tibia.  A small incision was made inferomedially and an Arthrex FlipCutter was then placed in anatomic ACL footprint and retrosocket was performed.  FiberWire suture was passed in place.  Debris was removed. The graft was delivered to the knee joint.  The femoral graft was delivered in a retrograde fashion into the femoral socket.  We used ACL TightRope system, visualized, but arthroscopically palpable visible radiographically that the periosteum was on the femoral cortex.  We then delivered the graft into the knee joint.  On the tibial side, it was placed into the tibial socket in an antegrade fashion, 30 degrees of flexion and neutral rotation.  __________ tightened down nicely.  The graft had excellent orientation and tension.  __________ with a button and then PushLock anchor.  We put the knee through range of motion several times, the knee was clinically stable.  Arthroscopically, the graft with excellent orientation and tension.  Radiographically, the button was in excellent position.  Arthroscopic equipment was removed. The portal was closed with 4-0 nylon suture.   Anterior incision was closed with subcu Vicryl, the skin was closed with nylon.  Sterile dressing was applied to the knee after placement of 10 mL of Sensorcaine and 4 mL of morphine sulfate to the knee joint.  Tourniquet was deflated.  Normal circulation to foot and ankle at the end of the case. A TED hose was applied followed by TROM brace and ice pack in full extension.  She tolerated the procedure well.  There were no complications or problems.  She was awakened.  She was taken from the operating room to PACU in stable condition.  She will be stabilized in PACU and discharged to home.  To help with the patient positioning, prepping and draping, technical and surgical assistance throughout the entire case, graft preparation, wound closure, application of dressing and sling, Mr. Wyatt Portela, PA-C's assistance was needed.          ______________________________ [name], [name]     RAC/MEDQ  D:  02/09/2014  T:  02/10/2014  Job:  [id number]

## 2014-02-10 NOTE — Op Note (Deleted)
Melissa Mejia, Melissa Mejia NO.:  [id number]  MEDICAL RECORD NO.:  [id number]  LOCATION:                                 FACILITY:  PHYSICIAN:  [name], M.D.DATE OF BIRTH:  1966/05/05  DATE OF PROCEDURE:  02/09/2014 DATE OF DISCHARGE:  02/09/2014                              OPERATIVE REPORT   PREOPERATIVE DIAGNOSIS:  Left knee torn anterior cruciate ligament, possible torn meniscus.  POSTOPERATIVE DIAGNOSES: 1. Left knee complete rupture of anterior cruciate ligament. 2. Chondral defect, medial femoral condyle, 1.5 x 1 cm, grade 3.  PROCEDURES: 1. Left knee arthroscopic-assisted allograft anterior cruciate     ligament reconstruction. 2. Chondroplasty, medial femoral condyle.  SURGEON:  [name], [name]  ASSISTANT:  Wyatt Portela, PA-C.  ANESTHESIA:  Regional with general.  ESTIMATED BLOOD LOSS:  Minimal.  DRAINS:  None.  COMPLICATIONS:  None.  TOURNIQUET TIME:  65 minutes at 250 mmHg.  DISPOSITION:  PACU, stable.  OPERATIVE DETAILS:  The patient was counseled in the holding area. Correct site was identified.  IV had been started.  IV sedation was given and block was administered.  Correct site was marked.  TED hose was applied on uninvolved leg.  On the way to the operating room, IV Ancef was given.  In the OR, placed in supine position under general anesthesia.  All extremities were well padded and bumped.  Left lower extremity was elevated.  Procedure was prepped with DuraPrep, and draped in a sterile fashion.  A time-out was done and confirmed the left lower extremity.  Exsanguinated with an Esmarch.  Tourniquet was inflated to 250 mmHg.  Arthroscopic portals were established; posteromedial, inferomedial, and inferolateral.  Diagnostic arthroscopy revealed complete rupture of the anterior cruciate ligament off the femoral attachment.  Used motorized shaver, notchplasty was performed, and PCL was protected.  An 80 size  was appropriate, likely an Arthrex allograft had been chosen, brought to the back table per Mr. Wyatt Portela, PA- C.  Patellofemoral joint with normal tracking.  Lateral compartment was __________ lateral meniscus.  Medial side was inspected.  The medial meniscal was intact.  Medial tibial plateau unremarkable with the medial femoral condyle, showed a 1.5 x 1 cm area of grade 3 chondromalacia, unstable for __________.  Mechanical chondroplasty was performed with a shaver and back to stable base.  The Arthrex __________ to the overtop position.  Small stab would was made laterally and Arthrex FlipCutter was placed to the anatomic ACL footprint and retrosocket was performed. FiberWire suture was passed in place.  Tunnel was rasped and debrided. On incision site, the guide was put into the anatomic ACL footprint on the tibia.  A small incision was made inferomedially and an Arthrex FlipCutter was then placed in anatomic ACL footprint and retrosocket was performed.  FiberWire suture was passed in place.  Debris was removed. The graft was delivered to the knee joint.  The femoral graft was delivered in a retrograde fashion into the femoral socket.  We used ACL TightRope system, visualized, but arthroscopically palpable visible radiographically that the periosteum was on the femoral cortex.  We then delivered the graft into the knee joint.  On the tibial side, it was placed into the tibial socket in an antegrade fashion, 30 degrees of flexion and neutral rotation.  __________ tightened down nicely.  The graft had excellent orientation and tension.  __________ with a button and then PushLock anchor.  We put the knee through range of motion several times, the knee was clinically stable.  Arthroscopically, the graft with excellent orientation and tension.  Radiographically, the button was in excellent position.  Arthroscopic equipment was removed. The portal was closed with 4-0 nylon suture.   Anterior incision was closed with subcu Vicryl, the skin was closed with nylon.  Sterile dressing was applied to the knee after placement of 10 mL of Sensorcaine and 4 mL of morphine sulfate to the knee joint.  Tourniquet was deflated.  Normal circulation to foot and ankle at the end of the case. A TED hose was applied followed by TROM brace and ice pack in full extension.  She tolerated the procedure well.  There were no complications or problems.  She was awakened.  She was taken from the operating room to PACU in stable condition.  She will be stabilized in PACU and discharged to home.  To help with the patient positioning, prepping and draping, technical and surgical assistance throughout the entire case, graft preparation, wound closure, application of dressing and sling, Mr. Wyatt Portela, PA-C's assistance was needed.          ______________________________ [name], [name]     RAC/MEDQ  D:  02/09/2014  T:  02/10/2014  Job:  [id number]

## 2014-02-10 NOTE — Op Note (Deleted)
Melissa Mejia, POGOSYAN NO.:  [id number]  MEDICAL RECORD NO.:  [id number]  LOCATION:                                 FACILITY:  PHYSICIAN:  [name], M.D.DATE OF BIRTH:  1966-04-11  DATE OF PROCEDURE:  02/09/2014 DATE OF DISCHARGE:  02/09/2014                              OPERATIVE REPORT   PREOPERATIVE DIAGNOSIS:  Left knee torn anterior cruciate ligament, possible torn meniscus.  POSTOPERATIVE DIAGNOSES: 1. Left knee complete rupture of anterior cruciate ligament. 2. Chondral defect, medial femoral condyle, 1.5 x 1 cm, grade 3.  PROCEDURES: 1. Left knee arthroscopic-assisted allograft anterior cruciate     ligament reconstruction. 2. Chondroplasty, medial femoral condyle.  SURGEON:  [name], [name]  ASSISTANT:  Wyatt Portela, PA-C.  ANESTHESIA:  Regional with general.  ESTIMATED BLOOD LOSS:  Minimal.  DRAINS:  None.  COMPLICATIONS:  None.  TOURNIQUET TIME:  65 minutes at 250 mmHg.  DISPOSITION:  PACU, stable.  OPERATIVE DETAILS:  The patient was counseled in the holding area. Correct site was identified.  IV had been started.  IV sedation was given and block was administered.  Correct site was marked.  TED hose was applied on uninvolved leg.  On the way to the operating room, IV Ancef was given.  In the OR, placed in supine position under general anesthesia.  All extremities were well padded and bumped.  Left lower extremity was elevated.  Procedure was prepped with DuraPrep, and draped in a sterile fashion.  A time-out was done and confirmed the left lower extremity.  Exsanguinated with an Esmarch.  Tourniquet was inflated to 250 mmHg.  Arthroscopic portals were established; posteromedial, inferomedial, and inferolateral.  Diagnostic arthroscopy revealed complete rupture of the anterior cruciate ligament off the femoral attachment.  Used motorized shaver, notchplasty was performed, and PCL was protected.  An 80 size  was appropriate, likely an Arthrex allograft had been chosen, brought to the back table per Mr. Wyatt Portela, PA- C.  Patellofemoral joint with normal tracking.  Lateral compartment was __________ lateral meniscus.  Medial side was inspected.  The medial meniscal was intact.  Medial tibial plateau unremarkable with the medial femoral condyle, showed a 1.5 x 1 cm area of grade 3 chondromalacia, unstable for __________.  Mechanical chondroplasty was performed with a shaver and back to stable base.  The Arthrex __________ to the overtop position.  Small stab would was made laterally and Arthrex FlipCutter was placed to the anatomic ACL footprint and retrosocket was performed. FiberWire suture was passed in place.  Tunnel was rasped and debrided. On incision site, the guide was put into the anatomic ACL footprint on the tibia.  A small incision was made inferomedially and an Arthrex FlipCutter was then placed in anatomic ACL footprint and retrosocket was performed.  FiberWire suture was passed in place.  Debris was removed. The graft was delivered to the knee joint.  The femoral graft was delivered in a retrograde fashion into the femoral socket.  We used ACL TightRope system, visualized, but arthroscopically palpable visible radiographically that the periosteum was on the femoral cortex.  We then delivered the graft into the knee joint.  On the tibial side, it was placed into the tibial socket in an antegrade fashion, 30 degrees of flexion and neutral rotation.  __________ tightened down nicely.  The graft had excellent orientation and tension.  __________ with a button and then PushLock anchor.  We put the knee through range of motion several times, the knee was clinically stable.  Arthroscopically, the graft with excellent orientation and tension.  Radiographically, the button was in excellent position.  Arthroscopic equipment was removed. The portal was closed with 4-0 nylon suture.   Anterior incision was closed with subcu Vicryl, the skin was closed with nylon.  Sterile dressing was applied to the knee after placement of 10 mL of Sensorcaine and 4 mL of morphine sulfate to the knee joint.  Tourniquet was deflated.  Normal circulation to foot and ankle at the end of the case. A TED hose was applied followed by TROM brace and ice pack in full extension.  She tolerated the procedure well.  There were no complications or problems.  She was awakened.  She was taken from the operating room to PACU in stable condition.  She will be stabilized in PACU and discharged to home.  To help with the patient positioning, prepping and draping, technical and surgical assistance throughout the entire case, graft preparation, wound closure, application of dressing and sling, Mr. Wyatt Portela, PA-C's assistance was needed.          ______________________________ [name], [name]     RAC/MEDQ  D:  02/09/2014  T:  02/10/2014  Job:  [id number]

## 2014-02-13 ENCOUNTER — Ambulatory Visit: Payer: 59 | Attending: Specialist | Admitting: Physical Therapy

## 2014-02-13 DIAGNOSIS — R269 Unspecified abnormalities of gait and mobility: Secondary | ICD-10-CM | POA: Insufficient documentation

## 2014-02-13 DIAGNOSIS — R609 Edema, unspecified: Secondary | ICD-10-CM | POA: Insufficient documentation

## 2014-02-13 DIAGNOSIS — R5381 Other malaise: Secondary | ICD-10-CM | POA: Insufficient documentation

## 2014-02-13 DIAGNOSIS — M25569 Pain in unspecified knee: Secondary | ICD-10-CM | POA: Insufficient documentation

## 2014-02-13 DIAGNOSIS — IMO0001 Reserved for inherently not codable concepts without codable children: Secondary | ICD-10-CM | POA: Insufficient documentation

## 2014-02-13 DIAGNOSIS — M6281 Muscle weakness (generalized): Secondary | ICD-10-CM | POA: Insufficient documentation

## 2014-02-14 ENCOUNTER — Encounter (HOSPITAL_BASED_OUTPATIENT_CLINIC_OR_DEPARTMENT_OTHER): Payer: Self-pay | Admitting: Specialist

## 2014-02-16 ENCOUNTER — Ambulatory Visit: Payer: 59 | Attending: Specialist | Admitting: Physical Therapy

## 2014-02-16 DIAGNOSIS — IMO0001 Reserved for inherently not codable concepts without codable children: Secondary | ICD-10-CM | POA: Insufficient documentation

## 2014-02-16 DIAGNOSIS — M6281 Muscle weakness (generalized): Secondary | ICD-10-CM | POA: Insufficient documentation

## 2014-02-16 DIAGNOSIS — R609 Edema, unspecified: Secondary | ICD-10-CM | POA: Insufficient documentation

## 2014-02-16 DIAGNOSIS — R269 Unspecified abnormalities of gait and mobility: Secondary | ICD-10-CM | POA: Insufficient documentation

## 2014-02-16 DIAGNOSIS — R5381 Other malaise: Secondary | ICD-10-CM | POA: Insufficient documentation

## 2014-02-16 DIAGNOSIS — M25569 Pain in unspecified knee: Secondary | ICD-10-CM | POA: Insufficient documentation

## 2014-02-20 ENCOUNTER — Ambulatory Visit: Payer: 59 | Admitting: Physical Therapy

## 2014-02-20 ENCOUNTER — Emergency Department (HOSPITAL_BASED_OUTPATIENT_CLINIC_OR_DEPARTMENT_OTHER)
Admission: EM | Admit: 2014-02-20 | Discharge: 2014-02-20 | Disposition: A | Discharge status: Home / Self Care | Payer: 59 | Attending: Emergency Medicine | Admitting: Emergency Medicine

## 2014-02-20 ENCOUNTER — Encounter (HOSPITAL_BASED_OUTPATIENT_CLINIC_OR_DEPARTMENT_OTHER): Payer: Self-pay | Admitting: Emergency Medicine

## 2014-02-20 ENCOUNTER — Emergency Department (HOSPITAL_BASED_OUTPATIENT_CLINIC_OR_DEPARTMENT_OTHER): Payer: 59

## 2014-02-20 DIAGNOSIS — M79662 Pain in left lower leg: Secondary | ICD-10-CM

## 2014-02-20 DIAGNOSIS — Z7982 Long term (current) use of aspirin: Secondary | ICD-10-CM | POA: Insufficient documentation

## 2014-02-20 DIAGNOSIS — Z9889 Other specified postprocedural states: Secondary | ICD-10-CM | POA: Insufficient documentation

## 2014-02-20 DIAGNOSIS — Z792 Long term (current) use of antibiotics: Secondary | ICD-10-CM | POA: Insufficient documentation

## 2014-02-20 DIAGNOSIS — M79609 Pain in unspecified limb: Secondary | ICD-10-CM | POA: Insufficient documentation

## 2014-02-20 DIAGNOSIS — Z8719 Personal history of other diseases of the digestive system: Secondary | ICD-10-CM | POA: Insufficient documentation

## 2014-02-20 DIAGNOSIS — Z87891 Personal history of nicotine dependence: Secondary | ICD-10-CM | POA: Insufficient documentation

## 2014-02-20 DIAGNOSIS — Z87828 Personal history of other (healed) physical injury and trauma: Secondary | ICD-10-CM | POA: Insufficient documentation

## 2014-02-20 NOTE — ED Notes (Signed)
Pt is post op left knee surgery 11 days ago.  Pt is now having left calf pain yesterday.

## 2014-02-20 NOTE — Discharge Instructions (Signed)

## 2014-02-20 NOTE — ED Provider Notes (Signed)
CSN: [id number]     Arrival date & time 02/20/14  6270 History   First MD Initiated Contact with Patient 02/20/14 [phone]     Chief Complaint  Patient presents with  . Leg Pain     (Consider location/radiation/quality/duration/timing/severity/associated sxs/prior Treatment) HPI Comments: Pt c/o left calf pain that started yesterday. Pt had surgery on her knee 11 days ago. Denies cp or sob. States that the knee seemed more warm yesterday and she has calf pain with walking. Pt has been ambulatory with brace.   The history is provided by the patient. No language interpreter was used.    Past Medical History  Diagnosis Date  . Right ACL tear   . History of esophageal reflux   . Wears contact lenses    Past Surgical History  Procedure Laterality Date  . Shoulder surgery Left 2008    labrum repair  . Wisdom tooth extraction    . Knee arthroscopy with anterior cruciate ligament (acl) repair with hamstring graft Left 02/09/2014    Procedure: LEFT KNEE ARTHROSCOPY WITH ALLOGRAFT ANTERIOR CRUCIATE LIGAMENT (ACL) HAMSTRING RECONSTRUCTION ;  Surgeon: Sydnee Cabal, MD;  Location: [facility];  Service: Orthopedics;  Laterality: Left;   No family history on file. History  Substance Use Topics  . Smoking status: Former Smoker -- 4 years    Types: Cigarettes    Quit date: 02/09/1993  . Smokeless tobacco: Never Used     Comment: hx  social some day smoker  . Alcohol Use: 1.8 oz/week    1 Glasses of wine, 2 Cans of beer per week   OB History   Grav Para Term Preterm Abortions TAB SAB Ect Mult Living                 Review of Systems  Constitutional: Negative.   Respiratory: Negative.   Cardiovascular: Negative.       Allergies  Review of patient's allergies indicates no known allergies.  Home Medications   Prior to Admission medications   Medication Sig Start Date End Date Taking? Authorizing Provider  aspirin EC 325 MG tablet Take 1 tablet (325 mg total) by  mouth 2 (two) times daily. 02/09/14   Bryson L Stilwell, PA-C  cephALEXin (KEFLEX) 500 MG capsule Take 1 capsule (500 mg total) by mouth 3 (three) times daily. 02/09/14   Bryson L Stilwell, PA-C  Green Tea, Camillia sinensis, (GREEN TEA EXTRACT PO) Take by mouth.    Historical Provider, MD  methocarbamol (ROBAXIN) 500 MG tablet Take 1 tablet (500 mg total) by mouth every 6 (six) hours as needed for muscle spasms. 02/09/14   Bryson L Stilwell, PA-C  Omega-3 Fatty Acids (FISH OIL PO) Take by mouth.    Historical Provider, MD  oxyCODONE-acetaminophen (ROXICET) 5-325 MG per tablet Take 1-2 tablets by mouth every 4 (four) hours as needed for severe pain. 02/09/14   Bryson L Stilwell, PA-C   BP 132/94  Pulse 73  Temp(Src) 99 F (37.2 C) (Oral)  Resp 18  SpO2 100%  LMP 02/02/2014 Physical Exam  Nursing note and vitals reviewed. Constitutional: She is oriented to person, place, and time. She appears well-developed and well-nourished.  Cardiovascular: Normal rate and regular rhythm.   Pulmonary/Chest: Effort normal and breath sounds normal.  Musculoskeletal:  Swelling noted to the left knee. No redness noted. Pt has appropriate rom for being post op  Neurological: She is alert and oriented to person, place, and time. She exhibits normal muscle tone. Coordination normal.  Skin:  Bruising noted to the left leg. Surgical scars noted    ED Course  Procedures (including critical care time) Labs Review Labs Reviewed - No data to display  Imaging Review US Venous Img Lower Unilateral Left  02/20/2014   CLINICAL DATA:  Recent knee arthroscopy with pain and swelling.  EXAM: Left LOWER EXTREMITY VENOUS DOPPLER ULTRASOUND  TECHNIQUE: Gray-scale sonography with graded compression, as well as color Doppler and duplex ultrasound were performed to evaluate the lower extremity deep venous systems from the level of the common femoral vein and including the common femoral, femoral, profunda femoral, popliteal and  calf veins including the posterior tibial, peroneal and gastrocnemius veins when visible. The superficial great saphenous vein was also interrogated. Spectral Doppler was utilized to evaluate flow at rest and with distal augmentation maneuvers in the common femoral, femoral and popliteal veins.  COMPARISON:  None.  FINDINGS: Common Femoral Vein: No evidence of thrombus. Normal compressibility, respiratory phasicity and response to augmentation.  Saphenofemoral Junction: No evidence of thrombus. Normal compressibility and flow on color Doppler imaging.  Profunda Femoral Vein: No evidence of thrombus. Normal compressibility and flow on color Doppler imaging.  Femoral Vein: No evidence of thrombus. Normal compressibility, respiratory phasicity and response to augmentation.  Popliteal Vein: No evidence of thrombus. Normal compressibility, respiratory phasicity and response to augmentation.  Calf Veins: No evidence of thrombus. Normal compressibility and flow on color Doppler imaging.  Superficial Great Saphenous Vein: Previously ablated.  Venous Reflux:  None.  Other Findings:  None.  IMPRESSION: No evidence of deep venous thrombosis.   Electronically Signed   By: Kalman Jewels [name]   On: 02/20/2014 10:34     EKG Interpretation None      MDM   Final diagnoses:  Calf pain, left    No dvt noted on scan. Pt is okay to follow up with ortho as needed:neurovascularly intact    Glendell Docker, NP 02/20/14 1043

## 2014-02-20 NOTE — ED Provider Notes (Signed)
Medical screening examination/treatment/procedure(s) were conducted as a shared visit with non-physician practitioner(s) and myself.  I personally evaluated the patient during the encounter. Patient is 11 days status post knee surgery. She presents today with complaints of calf pain with walking that started yesterday. She denies any new injury or trauma. She denies any chest pain or shortness of breath.  On exam, vitals are stable the patient is afebrile. Head is atraumatic normocephalic. The left lower extremity is noted to have mild swelling around the knee with scattered areas of ecchymosis. The incisions appear to be healing well. There is no distal edema. Dorsalis pedis pulses are palpable. Homans sign is equivocal.  Ultrasound reveals no evidence for DVT. This appears to be a calf strain. She will take anti-inflammatories at home as needed. She will return if her symptoms worsen or change.     Veryl Speak, MD 02/20/14 1520

## 2014-02-22 ENCOUNTER — Ambulatory Visit: Payer: 59 | Admitting: Physical Therapy

## 2014-02-24 ENCOUNTER — Ambulatory Visit: Payer: 59 | Admitting: Physical Therapy

## 2014-02-27 ENCOUNTER — Ambulatory Visit: Payer: 59 | Admitting: Physical Therapy

## 2014-03-01 ENCOUNTER — Ambulatory Visit: Payer: 59 | Admitting: Physical Therapy

## 2014-03-03 ENCOUNTER — Ambulatory Visit: Payer: 59 | Admitting: Physical Therapy

## 2014-03-06 ENCOUNTER — Ambulatory Visit: Payer: 59 | Admitting: Physical Therapy

## 2014-03-08 ENCOUNTER — Ambulatory Visit: Payer: 59 | Admitting: Physical Therapy

## 2014-03-10 ENCOUNTER — Ambulatory Visit: Payer: 59 | Admitting: Physical Therapy

## 2014-03-13 ENCOUNTER — Ambulatory Visit: Payer: 59 | Admitting: Physical Therapy

## 2014-03-15 ENCOUNTER — Ambulatory Visit: Payer: 59 | Admitting: Physical Therapy

## 2014-03-17 ENCOUNTER — Ambulatory Visit: Payer: 59 | Admitting: Physical Therapy

## 2014-03-29 ENCOUNTER — Ambulatory Visit: Payer: 59 | Attending: Specialist | Admitting: Physical Therapy

## 2014-03-29 DIAGNOSIS — R609 Edema, unspecified: Secondary | ICD-10-CM | POA: Insufficient documentation

## 2014-03-29 DIAGNOSIS — M25569 Pain in unspecified knee: Secondary | ICD-10-CM | POA: Diagnosis not present

## 2014-03-29 DIAGNOSIS — R269 Unspecified abnormalities of gait and mobility: Secondary | ICD-10-CM | POA: Insufficient documentation

## 2014-03-29 DIAGNOSIS — M6281 Muscle weakness (generalized): Secondary | ICD-10-CM | POA: Diagnosis not present

## 2014-03-29 DIAGNOSIS — IMO0001 Reserved for inherently not codable concepts without codable children: Secondary | ICD-10-CM | POA: Diagnosis not present

## 2014-03-29 DIAGNOSIS — R5381 Other malaise: Secondary | ICD-10-CM | POA: Insufficient documentation

## 2014-03-30 ENCOUNTER — Ambulatory Visit: Payer: 59 | Admitting: Physical Therapy

## 2014-04-03 ENCOUNTER — Ambulatory Visit: Payer: 59 | Admitting: Physical Therapy

## 2014-04-05 ENCOUNTER — Ambulatory Visit: Payer: 59 | Admitting: Physical Therapy

## 2014-04-07 ENCOUNTER — Ambulatory Visit: Payer: 59 | Admitting: Physical Therapy

## 2014-04-07 DIAGNOSIS — IMO0001 Reserved for inherently not codable concepts without codable children: Secondary | ICD-10-CM | POA: Diagnosis not present

## 2014-04-10 ENCOUNTER — Ambulatory Visit: Payer: 59 | Admitting: Physical Therapy

## 2014-04-10 DIAGNOSIS — IMO0001 Reserved for inherently not codable concepts without codable children: Secondary | ICD-10-CM | POA: Diagnosis not present

## 2014-04-14 ENCOUNTER — Ambulatory Visit: Payer: 59 | Admitting: Physical Therapy

## 2014-04-14 DIAGNOSIS — IMO0001 Reserved for inherently not codable concepts without codable children: Secondary | ICD-10-CM | POA: Diagnosis not present

## 2014-04-17 ENCOUNTER — Ambulatory Visit: Payer: 59 | Admitting: Physical Therapy

## 2014-04-19 ENCOUNTER — Ambulatory Visit: Payer: 59 | Attending: Specialist | Admitting: Physical Therapy

## 2014-04-19 DIAGNOSIS — M6281 Muscle weakness (generalized): Secondary | ICD-10-CM | POA: Insufficient documentation

## 2014-04-19 DIAGNOSIS — M25569 Pain in unspecified knee: Secondary | ICD-10-CM | POA: Insufficient documentation

## 2014-04-19 DIAGNOSIS — R609 Edema, unspecified: Secondary | ICD-10-CM | POA: Insufficient documentation

## 2014-04-19 DIAGNOSIS — R5381 Other malaise: Secondary | ICD-10-CM | POA: Diagnosis not present

## 2014-04-19 DIAGNOSIS — IMO0001 Reserved for inherently not codable concepts without codable children: Secondary | ICD-10-CM | POA: Insufficient documentation

## 2014-04-19 DIAGNOSIS — R269 Unspecified abnormalities of gait and mobility: Secondary | ICD-10-CM | POA: Insufficient documentation

## 2014-04-26 ENCOUNTER — Ambulatory Visit: Payer: 59 | Admitting: Physical Therapy

## 2014-04-26 DIAGNOSIS — IMO0001 Reserved for inherently not codable concepts without codable children: Secondary | ICD-10-CM | POA: Diagnosis not present

## 2014-04-28 ENCOUNTER — Ambulatory Visit: Payer: 59 | Admitting: Physical Therapy

## 2014-05-01 ENCOUNTER — Ambulatory Visit: Payer: 59 | Admitting: Physical Therapy

## 2014-05-01 DIAGNOSIS — IMO0001 Reserved for inherently not codable concepts without codable children: Secondary | ICD-10-CM | POA: Diagnosis not present

## 2014-05-05 ENCOUNTER — Ambulatory Visit: Payer: 59 | Admitting: Physical Therapy

## 2014-05-05 DIAGNOSIS — IMO0001 Reserved for inherently not codable concepts without codable children: Secondary | ICD-10-CM | POA: Diagnosis not present

## 2014-05-08 ENCOUNTER — Ambulatory Visit: Payer: 59 | Admitting: Physical Therapy

## 2014-05-12 ENCOUNTER — Ambulatory Visit: Payer: 59 | Admitting: Physical Therapy

## 2014-05-12 DIAGNOSIS — IMO0001 Reserved for inherently not codable concepts without codable children: Secondary | ICD-10-CM | POA: Diagnosis not present

## 2014-05-17 ENCOUNTER — Ambulatory Visit: Payer: 59 | Admitting: Physical Therapy

## 2014-05-17 DIAGNOSIS — IMO0001 Reserved for inherently not codable concepts without codable children: Secondary | ICD-10-CM | POA: Diagnosis not present

## 2015-11-15 DIAGNOSIS — F900 Attention-deficit hyperactivity disorder, predominantly inattentive type: Secondary | ICD-10-CM | POA: Diagnosis not present

## 2015-11-15 MED FILL — DEXTROAMP-AMPHET ER 15 MG C: 15 | 30 days supply | Qty: 30 | Fill #0

## 2015-11-15 MED FILL — AMPHETAMINE SALTS 20 MG TAB: 20 | 30 days supply | Qty: 30 | Fill #0

## 2015-12-03 DIAGNOSIS — H5213 Myopia, bilateral: Secondary | ICD-10-CM | POA: Diagnosis not present

## 2015-12-03 DIAGNOSIS — H1045 Other chronic allergic conjunctivitis: Secondary | ICD-10-CM | POA: Diagnosis not present

## 2015-12-03 DIAGNOSIS — H52222 Regular astigmatism, left eye: Secondary | ICD-10-CM | POA: Diagnosis not present

## 2015-12-03 DIAGNOSIS — H524 Presbyopia: Secondary | ICD-10-CM | POA: Diagnosis not present

## 2015-12-27 MED FILL — DEXTROAMP-AMPHET ER 15 MG C: 15 | 30 days supply | Qty: 30 | Fill #0

## 2015-12-27 MED FILL — DEXTROAMP-AMPHETAMIN 20 MG: 20 | 30 days supply | Qty: 30 | Fill #0

## 2016-02-21 MED FILL — DEXTROAMP-AMPHET ER 15 MG C: 15 | 30 days supply | Qty: 30 | Fill #0

## 2016-02-21 MED FILL — DEXTROAMP-AMPHETAMIN 20 MG: 20 | 30 days supply | Qty: 30 | Fill #0

## 2016-04-24 MED FILL — DEXTROAMP-AMPHETAMIN 20 MG: 20 | 30 days supply | Qty: 30 | Fill #0

## 2016-04-24 MED FILL — DEXTROAMP-AMPHET ER 15 MG C: 15 | 30 days supply | Qty: 30 | Fill #0

## 2016-06-06 DIAGNOSIS — F900 Attention-deficit hyperactivity disorder, predominantly inattentive type: Secondary | ICD-10-CM | POA: Diagnosis not present

## 2016-06-09 MED FILL — DEXTROAMP-AMPHET ER 15 MG C: 15 | 30 days supply | Qty: 30 | Fill #0

## 2016-06-09 MED FILL — AMPHETAMINE SALTS 20 MG TAB: 20 | 30 days supply | Qty: 30 | Fill #0

## 2016-07-11 MED FILL — AMPHETAMINE SALTS 20 MG TAB: 20 | 30 days supply | Qty: 30 | Fill #0

## 2016-07-11 MED FILL — DEXTROAMP-AMPHET ER 15 MG C: 15 | 30 days supply | Qty: 30 | Fill #0

## 2016-08-15 MED FILL — DEXTROAMP-AMPHET ER 15 MG C: 15 | 30 days supply | Qty: 30 | Fill #0

## 2016-08-15 MED FILL — DEXTROAMP-AMPHETAMIN 20 MG: 20 | 30 days supply | Qty: 30 | Fill #0

## 2016-11-11 ENCOUNTER — Telehealth: Payer: Self-pay

## 2016-11-11 NOTE — Telephone Encounter (Signed)
Pre visit call made to patient. States she will stop by and update information for chart. 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

## 2016-11-12 ENCOUNTER — Ambulatory Visit (INDEPENDENT_AMBULATORY_CARE_PROVIDER_SITE_OTHER): Payer: 59 | Admitting: Family

## 2016-11-12 ENCOUNTER — Encounter: Payer: Self-pay | Admitting: Family

## 2016-11-12 VITALS — BP 125/87 | HR 78 | Temp 98.5°F | Resp 16 | Ht 65.0 in | Wt 173.8 lb

## 2016-11-12 DIAGNOSIS — E079 Disorder of thyroid, unspecified: Secondary | ICD-10-CM | POA: Diagnosis not present

## 2016-11-12 DIAGNOSIS — F909 Attention-deficit hyperactivity disorder, unspecified type: Secondary | ICD-10-CM | POA: Diagnosis not present

## 2016-11-12 DIAGNOSIS — R5383 Other fatigue: Secondary | ICD-10-CM | POA: Diagnosis not present

## 2016-11-12 DIAGNOSIS — K219 Gastro-esophageal reflux disease without esophagitis: Secondary | ICD-10-CM

## 2016-11-12 DIAGNOSIS — R1900 Intra-abdominal and pelvic swelling, mass and lump, unspecified site: Secondary | ICD-10-CM | POA: Diagnosis not present

## 2016-11-12 LAB — CBC WITH DIFFERENTIAL/PLATELET
BASOS ABS: 0.1 10*3/uL (ref 0.0–0.1)
BASOS PCT: 0.8 % (ref 0.0–3.0)
EOS ABS: 0.1 10*3/uL (ref 0.0–0.7)
Eosinophils Relative: 2.1 % (ref 0.0–5.0)
HEMATOCRIT: 39.9 % (ref 36.0–46.0)
Hemoglobin: 13.6 g/dL (ref 12.0–15.0)
LYMPHS PCT: 21.7 % (ref 12.0–46.0)
Lymphs Abs: 1.4 10*3/uL (ref 0.7–4.0)
MCHC: 34 g/dL (ref 30.0–36.0)
MCV: 90.5 fl (ref 78.0–100.0)
Monocytes Absolute: 0.6 10*3/uL (ref 0.1–1.0)
Monocytes Relative: 9.7 % (ref 3.0–12.0)
Neutro Abs: 4.2 10*3/uL (ref 1.4–7.7)
Neutrophils Relative %: 65.7 % (ref 43.0–77.0)
Platelets: 265 10*3/uL (ref 150.0–400.0)
RBC: 4.42 Mil/uL (ref 3.87–5.11)
RDW: 12.7 % (ref 11.5–15.5)
WBC: 6.4 10*3/uL (ref 4.0–10.5)

## 2016-11-12 LAB — TSH: TSH: 1.58 u[IU]/mL (ref 0.35–4.50)

## 2016-11-12 NOTE — Progress Notes (Signed)
Subjective:    Patient ID: Melissa Mejia, female    DOB: 04/13/1966, 51 y.o.   MRN: [id number]  HPI  Melissa Mejia is a 51 yr old female who present today to establish care.  ADD-  Reports she has had issues as a child.  She established with PA at [facility].  She was diagnosed with ADHD.   GERD- uses prn otc   Dentist told her that he thinks that her thyroid is enlarged.      Review of Systems  Constitutional: Negative for unexpected weight change.  HENT: Negative for hearing loss.   Eyes: Negative for visual disturbance.  Cardiovascular:       Has some LE swelling after 12 hour shift  Gastrointestinal: Negative for constipation and diarrhea.  Genitourinary: Negative for dysuria and frequency.       Irregular periods.  LMP 3/18  Musculoskeletal: Negative for arthralgias and myalgias.       Occasional cramping in her feet  Skin: Negative for rash.  Neurological: Negative for headaches.  Hematological: Negative for adenopathy.  Psychiatric/Behavioral:       Denies depression/anxiety   Past Medical History:  Diagnosis Date  . ADD (attention deficit disorder)   . History of esophageal reflux   . Right ACL tear   . Wears contact lenses      Social History   Social History  . Marital status: Married    Spouse name: N/A  . Number of children: N/A  . Years of education: N/A   Occupational History  . Not on file.   Social History Main Topics  . Smoking status: Former Smoker    Years: 4.00    Types: Cigarettes    Quit date: 02/09/1993  . Smokeless tobacco: Never Used     Comment: hx  social some day smoker  . Alcohol use 1.8 oz/week    1 Glasses of wine, 2 Cans of beer per week     Comment: occasional   . Drug use: No  . Sexual activity: Not on file   Other Topics Concern  . 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    Enjoys cooking,  skiing    Past Surgical History:  Procedure Laterality Date  . KNEE ARTHROSCOPY WITH ANTERIOR CRUCIATE LIGAMENT (ACL) REPAIR WITH HAMSTRING GRAFT Left 02/09/2014   Procedure: LEFT KNEE ARTHROSCOPY WITH ALLOGRAFT ANTERIOR CRUCIATE LIGAMENT (ACL) HAMSTRING RECONSTRUCTION ;  Surgeon: Sydnee Cabal, MD;  Location: [facility];  Service: Orthopedics;  Laterality: Left;  . SHOULDER SURGERY Left 2008   labrum repair  . WISDOM TOOTH EXTRACTION      Family History  Problem Relation Age of Onset  . Cancer Father     throat cancer    No Known Allergies  No current outpatient prescriptions on file prior to visit.   No current facility-administered medications on file prior to visit.     BP 125/87 (BP Location: Left Arm, Patient Position: Sitting, Cuff Size: Normal)   Pulse 78   Temp 98.5 F (36.9 C) (Oral)   Resp 16   Ht 5\' 5"  (1.651 m)   Wt 173 lb 12.8 oz (78.8 kg)   LMP 10/05/2016   SpO2 100%   BMI 28.92 kg/m       Objective:   Physical Exam  Constitutional: She is  oriented to person, place, and time. She appears well-developed and well-nourished.  Neck:  Slight asymmetry of thyroid L>R  Cardiovascular: Normal rate, regular rhythm and normal heart sounds.   No murmur heard. Pulmonary/Chest: Effort normal and breath sounds normal. No respiratory distress. She has no wheezes.  Musculoskeletal: She exhibits no edema.  Neurological: She is alert and oriented to person, place, and time.  Psychiatric: She has a normal mood and affect. Her behavior is normal. Judgment and thought content normal.          Assessment & Plan:  Thyroid asymmetry- obtain TSH, thyroid ultrasound.

## 2016-11-12 NOTE — Progress Notes (Signed)
Pre visit review using our clinic review tool, if applicable. No additional management support is needed unless otherwise documented below in the visit note. 

## 2016-11-12 NOTE — Patient Instructions (Signed)
Please complete lab work prior to leaving. Schedule your ultrasounds on the first floor. Welcome to [facility]!

## 2016-11-13 ENCOUNTER — Telehealth: Payer: Self-pay

## 2016-11-13 NOTE — Telephone Encounter (Signed)
Epigastric area please

## 2016-11-13 NOTE — Telephone Encounter (Signed)
Reatha Harps from imaging called about US abdomen complete. She wanted to know if you could specify what area of abdomen should she look for the hernia. She did not see it in your note. Please advise.

## 2016-11-13 NOTE — Telephone Encounter (Signed)
Spoke with Rachael from imaging she made note.

## 2016-11-14 ENCOUNTER — Ambulatory Visit (HOSPITAL_BASED_OUTPATIENT_CLINIC_OR_DEPARTMENT_OTHER)
Admission: RE | Admit: 2016-11-14 | Discharge: 2016-11-14 | Disposition: A | Discharge status: Home / Self Care | Payer: 59 | Source: Ambulatory Visit | Attending: Family | Admitting: Family

## 2016-11-14 DIAGNOSIS — F909 Attention-deficit hyperactivity disorder, unspecified type: Secondary | ICD-10-CM | POA: Insufficient documentation

## 2016-11-14 DIAGNOSIS — E079 Disorder of thyroid, unspecified: Secondary | ICD-10-CM | POA: Diagnosis present

## 2016-11-14 DIAGNOSIS — R1900 Intra-abdominal and pelvic swelling, mass and lump, unspecified site: Secondary | ICD-10-CM | POA: Diagnosis not present

## 2016-11-14 DIAGNOSIS — K439 Ventral hernia without obstruction or gangrene: Secondary | ICD-10-CM | POA: Insufficient documentation

## 2016-11-14 DIAGNOSIS — K219 Gastro-esophageal reflux disease without esophagitis: Secondary | ICD-10-CM | POA: Insufficient documentation

## 2016-11-14 DIAGNOSIS — E041 Nontoxic single thyroid nodule: Secondary | ICD-10-CM | POA: Insufficient documentation

## 2016-11-14 NOTE — Assessment & Plan Note (Signed)
Stable, management per psychiatry.  

## 2016-11-14 NOTE — Assessment & Plan Note (Signed)
Stable with prn otc meds such as pepcid complete

## 2016-11-18 ENCOUNTER — Telehealth: Payer: Self-pay | Admitting: Family

## 2016-11-18 DIAGNOSIS — K439 Ventral hernia without obstruction or gangrene: Secondary | ICD-10-CM

## 2016-11-18 NOTE — Telephone Encounter (Signed)
Spoke with pt about results. Pt voiced understanding. Sent referral for surgical consult.

## 2016-11-18 NOTE — Telephone Encounter (Signed)
Ultrasound shows epigastric hernia. I would recommend surgical consult.  (pended).

## 2016-11-18 NOTE — Telephone Encounter (Signed)
Also, thyroid US shows very small benign appearing nodule in the left lobe of her thyroid.  No follow up needed.

## 2016-11-27 MED FILL — DEXTROAMP-AMPHETAMIN 20 MG: 20 | 30 days supply | Qty: 30 | Fill #0

## 2016-12-19 ENCOUNTER — Encounter: Payer: Self-pay | Admitting: Family Medicine

## 2016-12-19 ENCOUNTER — Ambulatory Visit (INDEPENDENT_AMBULATORY_CARE_PROVIDER_SITE_OTHER): Payer: 59 | Admitting: Family Medicine

## 2016-12-19 DIAGNOSIS — M25512 Pain in left shoulder: Secondary | ICD-10-CM

## 2016-12-19 DIAGNOSIS — S46812A Strain of other muscles, fascia and tendons at shoulder and upper arm level, left arm, initial encounter: Secondary | ICD-10-CM

## 2016-12-19 NOTE — Patient Instructions (Signed)
You have trapezius strain/spasms. Start physical therapy and do home exercises, stretches on days you don't go to therapy. I don't think medications will help you with this. Let me know how you're doing in a month to 6 weeks but you don't have to make a formal follow up appointment unless you're struggling.  You have rotator cuff impingement Try to avoid painful activities (overhead activities, lifting with extended arm) as much as possible. Aleve or ibuprofen only if needed. Can take tylenol in addition to this. Do home exercise program with theraband and scapular stabilization exercises daily 3 sets of 10 once a day. Subacromial injection may be beneficial to help with pain and to decrease inflammation. Consider physical therapy with transition to home exercise program. If not improving at follow-up we will consider further imaging, injection, physical therapy, and/or nitro patches.

## 2016-12-23 DIAGNOSIS — S46819A Strain of other muscles, fascia and tendons at shoulder and upper arm level, unspecified arm, initial encounter: Secondary | ICD-10-CM | POA: Insufficient documentation

## 2016-12-23 DIAGNOSIS — M25512 Pain in left shoulder: Secondary | ICD-10-CM | POA: Insufficient documentation

## 2016-12-23 NOTE — Assessment & Plan Note (Signed)
Rotator cuff impingement - shown home exercises to do for left shoulder.  Aleve or ibuprofen, tylenol if needed.  Consider injection, PT for this if not improving.

## 2016-12-23 NOTE — Assessment & Plan Note (Signed)
start physical therapy and home exercises.  Aleve or ibuprofen if needed, heat for spasms.

## 2016-12-23 NOTE — Progress Notes (Signed)
PCP: Debbrah Alar, NP  Subjective:   HPI: Patient is a 51 y.o. female here for neck pain.  Patient reports she's had a few months of neck pain on the left side. Reports she always holds tension here and sore but worse recently. She would like to do PT. Worse turning head to the left. Stopped doing pilates past 2 weeks because of the pain. No numbness or tingling. As an aside she also has pain in left shoulder worse with overhead motions. No skin changes.  Past Medical History:  Diagnosis Date  . ADD (attention deficit disorder)   . History of esophageal reflux   . Right ACL tear   . Wears contact lenses     Current Outpatient Prescriptions on File Prior to Visit  Medication Sig Dispense Refill  . amphetamine-dextroamphetamine (ADDERALL XR) 15 MG 24 hr capsule Take 15 mg by mouth every morning.    Marland Kitchen amphetamine-dextroamphetamine (ADDERALL) 20 MG tablet Take 20 mg by mouth daily as needed.     No current facility-administered medications on file prior to visit.     Past Surgical History:  Procedure Laterality Date  . KNEE ARTHROSCOPY WITH ANTERIOR CRUCIATE LIGAMENT (ACL) REPAIR WITH HAMSTRING GRAFT Left 02/09/2014   Procedure: LEFT KNEE ARTHROSCOPY WITH ALLOGRAFT ANTERIOR CRUCIATE LIGAMENT (ACL) HAMSTRING RECONSTRUCTION ;  Surgeon: Sydnee Cabal, MD;  Location: [facility];  Service: Orthopedics;  Laterality: Left;  . SHOULDER SURGERY Left 2008   labrum repair  . WISDOM TOOTH EXTRACTION      No Known Allergies  Social History   Social History  . Marital status: Married    Spouse name: N/A  . Number of children: N/A  . Years of education: N/A   Occupational History  . Not on file.   Social History Main Topics  . Smoking status: Former Smoker    Years: 4.00    Types: Cigarettes    Quit date: 02/09/1993  . Smokeless tobacco: Never Used     Comment: hx  social some day smoker  . Alcohol use 1.8 oz/week    1 Glasses of wine, 2 Cans of beer  per week     Comment: occasional   . Drug use: No  . Sexual activity: Not on file   Other Topics Concern  . 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    Enjoys cooking, skiing    Family History  Problem Relation Age of Onset  . Cancer Father     throat cancer    BP 135/64   Pulse 75   Ht 5\' 4"  (1.626 m)   Review of Systems: See HPI above.     Objective:  Physical Exam:  Gen: NAD, comfortable in exam room  Neck: No gross deformity, swelling, bruising. TTP left > right paraspinal cervical regions, trapezius.  No midline/bony TTP. FROM neck - pain on left lateral rotation. BUE strength 5/5.   Sensation intact to light touch.   1+ equal reflexes in triceps, biceps, brachioradialis tendons. NV intact distal BUEs.  Left shoulder: No swelling, ecchymoses.  No gross deformity. No TTP. FROM with painful arc. Negative Hawkins, positive Neers. Negative Yergasons. Strength 5/5 with empty can and resisted internal/external rotation. Negative apprehension. NV intact distally.   Assessment & Plan:  1. Trapezius strain/spasms - start physical therapy and home exercises.  Aleve or ibuprofen if needed, heat for spasms.  2. Rotator cuff impingement - shown home exercises to do for left shoulder.  Aleve or ibuprofen, tylenol if needed.  Consider injection, PT for this if not improving.

## 2016-12-26 ENCOUNTER — Ambulatory Visit (INDEPENDENT_AMBULATORY_CARE_PROVIDER_SITE_OTHER): Payer: 59 | Admitting: Family

## 2016-12-26 ENCOUNTER — Other Ambulatory Visit (HOSPITAL_COMMUNITY)
Admission: RE | Admit: 2016-12-26 | Discharge: 2016-12-26 | Disposition: A | Discharge status: Home / Self Care | Payer: 59 | Source: Ambulatory Visit | Attending: Family | Admitting: Family

## 2016-12-26 ENCOUNTER — Encounter: Payer: Self-pay | Admitting: Family

## 2016-12-26 VITALS — BP 110/79 | HR 81 | Temp 98.4°F | Resp 18 | Ht 65.0 in | Wt 173.4 lb

## 2016-12-26 DIAGNOSIS — Z01419 Encounter for gynecological examination (general) (routine) without abnormal findings: Secondary | ICD-10-CM | POA: Insufficient documentation

## 2016-12-26 DIAGNOSIS — Z0000 Encounter for general adult medical examination without abnormal findings: Secondary | ICD-10-CM

## 2016-12-26 LAB — URINALYSIS, ROUTINE W REFLEX MICROSCOPIC
Bilirubin Urine: NEGATIVE
Hgb urine dipstick: NEGATIVE
KETONES UR: NEGATIVE
Leukocytes, UA: NEGATIVE
Nitrite: NEGATIVE
RBC / HPF: NONE SEEN (ref 0–?)
SPECIFIC GRAVITY, URINE: 1.025 (ref 1.000–1.030)
TOTAL PROTEIN, URINE-UPE24: NEGATIVE
URINE GLUCOSE: NEGATIVE
Urobilinogen, UA: 0.2 (ref 0.0–1.0)
pH: 6 (ref 5.0–8.0)

## 2016-12-26 LAB — HEPATIC FUNCTION PANEL
ALBUMIN: 3.8 g/dL (ref 3.5–5.2)
ALT: 15 U/L (ref 0–35)
AST: 16 U/L (ref 0–37)
Alkaline Phosphatase: 56 U/L (ref 39–117)
BILIRUBIN TOTAL: 0.4 mg/dL (ref 0.2–1.2)
Bilirubin, Direct: 0.1 mg/dL (ref 0.0–0.3)
TOTAL PROTEIN: 6.7 g/dL (ref 6.0–8.3)

## 2016-12-26 LAB — BASIC METABOLIC PANEL
BUN: 17 mg/dL (ref 6–23)
CHLORIDE: 105 meq/L (ref 96–112)
CO2: 28 meq/L (ref 19–32)
Calcium: 8.6 mg/dL (ref 8.4–10.5)
Creatinine, Ser: 0.94 mg/dL (ref 0.40–1.20)
GFR: 67.06 mL/min (ref >60.00)
GLUCOSE: 90 mg/dL (ref 70–99)
POTASSIUM: 3.8 meq/L (ref 3.5–5.1)
SODIUM: 137 meq/L (ref 135–145)

## 2016-12-26 LAB — LIPID PANEL
CHOLESTEROL: 148 mg/dL (ref 0–200)
HDL: 60.6 mg/dL (ref >39.00)
LDL Cholesterol: 78 mg/dL (ref 0–99)
NonHDL: 87.01
TRIGLYCERIDES: 43 mg/dL (ref 0.0–149.0)
Total CHOL/HDL Ratio: 2
VLDL: 8.6 mg/dL (ref 0.0–40.0)

## 2016-12-26 NOTE — Progress Notes (Signed)
Subjective:    Patient ID: Melissa Mejia, female    DOB: 1965-12-03, 51 y.o.   MRN: [id number]  HPI  Melissa Mejia is a 51 yr old female who presents today for complete physical.  Immunizations: tetanus up to date Diet: eating out more, not eating healthy, attributes to stress Wt Readings from Last 3 Encounters:  12/26/16 173 lb 6.4 oz (78.7 kg)  11/12/16 173 lb 12.8 oz (78.8 kg)  02/09/14 161 lb (73 kg)  Exercise: not exercising regularly Colonoscopy: due in AugustPap Smear: just started today Mammogram: 2011     Review of Systems  Constitutional: Negative for unexpected weight change.  HENT: Positive for rhinorrhea. Negative for hearing loss.        Some allergy congestion  Eyes: Negative for visual disturbance.  Respiratory: Negative for cough.   Cardiovascular:       Some swelling after 12 hour shift  Gastrointestinal: Negative for constipation and diarrhea.       Will schedule surgical appointment  Genitourinary: Negative for dysuria, frequency and hematuria.  Musculoskeletal: Negative for arthralgias and myalgias.  Skin: Negative for rash.  Neurological:       Rare headaches  Hematological: Negative for adenopathy.  Psychiatric/Behavioral:       Denies depression/anxiety       Past Medical History:  Diagnosis Date  . ADD (attention deficit disorder)   . History of esophageal reflux   . Right ACL tear   . Wears contact lenses      Social History   Social History  . Marital status: Married    Spouse name: N/A  . Number of children: N/A  . Years of education: N/A   Occupational History  . Not on file.   Social History Main Topics  . Smoking status: Former Smoker    Years: 4.00    Types: Cigarettes    Quit date: 02/09/1993  . Smokeless tobacco: Never Used     Comment: hx  social some day smoker  . Alcohol use 1.8 oz/week    1 Glasses of wine, 2 Cans of beer per week     Comment: occasional   . Drug use: No  . Sexual activity: Not on file    Other Topics Concern  . 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    Enjoys cooking, skiing    Past Surgical History:  Procedure Laterality Date  . KNEE ARTHROSCOPY WITH ANTERIOR CRUCIATE LIGAMENT (ACL) REPAIR WITH HAMSTRING GRAFT Left 02/09/2014   Procedure: LEFT KNEE ARTHROSCOPY WITH ALLOGRAFT ANTERIOR CRUCIATE LIGAMENT (ACL) HAMSTRING RECONSTRUCTION ;  Surgeon: Sydnee Cabal, MD;  Location: [place];  Service: Orthopedics;  Laterality: Left;  . SHOULDER SURGERY Left 2008   labrum repair  . WISDOM TOOTH EXTRACTION      Family History  Problem Relation Age of Onset  . Cancer Father        throat cancer    No Known Allergies  Current Outpatient Prescriptions on File Prior to Visit  Medication Sig Dispense Refill  . amphetamine-dextroamphetamine (ADDERALL XR) 15 MG 24 hr capsule Take 15 mg by mouth every morning.    Marland Kitchen amphetamine-dextroamphetamine (ADDERALL) 20 MG tablet Take 20 mg by mouth daily as needed.     No current facility-administered medications on file prior to visit.  BP 110/79 (BP Location: Right Arm, Cuff Size: Large)   Pulse 81   Temp 98.4 F (36.9 C) (Oral)   Resp 18   Ht 5\' 5"  (1.651 m)   Wt 173 lb 6.4 oz (78.7 kg)   LMP 11/20/2016   SpO2 100%   BMI 28.86 kg/m    Objective:   Physical Exam  Physical Exam  Constitutional: She is oriented to person, place, and time. She appears well-developed and well-nourished. No distress.  HENT:  Head: Normocephalic and atraumatic.  Right Ear: Tympanic membrane and ear canal normal.  Left Ear: Tympanic membrane and ear canal normal.  Mouth/Throat: Oropharynx is clear and moist.  Eyes: Pupils are equal, round, and reactive to light. No scleral icterus.  Neck: Normal range of motion. No thyromegaly present.  Cardiovascular: Normal rate and regular rhythm.   No murmur  heard. Pulmonary/Chest: Effort normal and breath sounds normal. No respiratory distress. He has no wheezes. She has no rales. She exhibits no tenderness.  Abdominal: Soft. Bowel sounds are normal. She exhibits no distension.  Epigastric hernia is again noted- non-reducible. There is no tenderness. There is no rebound and no guarding.  Musculoskeletal: She exhibits no edema.  Lymphadenopathy:    She has no cervical adenopathy.  Neurological: She is alert and oriented to person, place, and time. She has normal patellar reflexes. She exhibits normal muscle tone. Coordination normal.  Skin: Skin is warm and dry.  Psychiatric: She has a normal mood and affect. Her behavior is normal. Judgment and thought content normal.  Breasts: Examined lying Right: Without masses, retractions, discharge or axillary adenopathy.  Left: Without masses, retractions, discharge or axillary adenopathy.  Inguinal/mons: Normal without inguinal adenopathy  External genitalia: Normal  BUS/Urethra/Skene's glands: Normal  Bladder: Normal  Vagina: Normal (some menstrual blood noted) Cervix: Normal  Uterus: some small external fibroids noted . Midline and mobile  Adnexa/parametria:  Rt: Without masses or tenderness.  Lt: Without masses or tenderness.  Anus and perineum: Normal            Assessment & Plan:    Preventative care- discussed healthy diet, exercise and weight loss. Obtain routine lab work. Immunizations reviewed. Pap performed.  Refer for colo and mammogram.      Assessment & Plan:

## 2016-12-26 NOTE — Patient Instructions (Addendum)
Please complete lab work prior to leaving.  Work on [employer], increasing exercise and weight loss.  Please schedule consult with surgery re: your hernia.

## 2016-12-26 NOTE — Addendum Note (Signed)
Addended by: Kelle Darting A on: 12/26/2016 01:50 PM   Modules accepted: Orders

## 2016-12-26 NOTE — Progress Notes (Signed)
Pre visit review using our clinic review tool, if applicable. No additional management support is needed unless otherwise documented below in the visit note. 

## 2016-12-30 LAB — CYTOLOGY - PAP
Diagnosis: NEGATIVE
HPV (WINDOPATH): NOT DETECTED

## 2017-01-05 NOTE — Addendum Note (Signed)
Addended by: Sherrie George F on: 01/05/2017 04:19 PM   Modules accepted: Orders

## 2017-01-14 ENCOUNTER — Ambulatory Visit: Payer: 59 | Attending: Family Medicine | Admitting: Physical Therapy

## 2017-01-14 ENCOUNTER — Encounter: Payer: Self-pay | Admitting: Physical Therapy

## 2017-01-14 DIAGNOSIS — M542 Cervicalgia: Secondary | ICD-10-CM | POA: Diagnosis not present

## 2017-01-14 DIAGNOSIS — M6281 Muscle weakness (generalized): Secondary | ICD-10-CM | POA: Diagnosis not present

## 2017-01-14 DIAGNOSIS — G8929 Other chronic pain: Secondary | ICD-10-CM | POA: Diagnosis not present

## 2017-01-14 DIAGNOSIS — R252 Cramp and spasm: Secondary | ICD-10-CM | POA: Diagnosis not present

## 2017-01-14 DIAGNOSIS — M25512 Pain in left shoulder: Secondary | ICD-10-CM | POA: Insufficient documentation

## 2017-01-14 NOTE — Patient Instructions (Signed)
   Place the arm on the affected side behind your back and use your other hand to draw your head downward and towards the opposite side.   You should be looking towards your opposite pocket of the affected side.hold 30 sec 2 times 2 times per day.     Gently draw your chin downward towards your chest as your fingers assist in adding a stretch to the back of your head. 10 times 2 times per day.    While sitting upright, put one hand on the back of your head, and put the other hand clasped on your chin. Tilt the head down and apply a firm force through both hands to feel a moderate stretch in the back of the upper neck. Hold for at least 30 seconds.   [facility] [address], [place], [place] [address] Phone # [phone] Fax [phone]

## 2017-01-14 NOTE — Therapy (Signed)
[facility] [address] [place], [place], [address] Phone: [phone]   Fax:  [phone]  Physical Therapy Evaluation  Patient Details  Name: KAHLIYA FRALEIGH MRN: [id number] Date of Birth: October 23, 1967 Referring Provider: Dr. Karlton Lemon  Encounter Date: 01/14/2017      PT End of Session - 01/14/17 1619    Visit Number 1   Date for PT Re-Evaluation 03/11/17   PT Start Time 0254   PT Stop Time 1618   PT Time Calculation (min) 48 min   Activity Tolerance Patient tolerated treatment well   Behavior During Therapy [facility] for tasks assessed/performed      Past Medical History:  Diagnosis Date  . ADD (attention deficit disorder)   . History of esophageal reflux   . Right ACL tear   . Wears contact lenses     Past Surgical History:  Procedure Laterality Date  . KNEE ARTHROSCOPY WITH ANTERIOR CRUCIATE LIGAMENT (ACL) REPAIR WITH HAMSTRING GRAFT Left 02/09/2014   Procedure: LEFT KNEE ARTHROSCOPY WITH ALLOGRAFT ANTERIOR CRUCIATE LIGAMENT (ACL) HAMSTRING RECONSTRUCTION ;  Surgeon: Sydnee Cabal, MD;  Location: [place];  Service: Orthopedics;  Laterality: Left;  . SHOULDER SURGERY Left 2008   labrum repair  . WISDOM TOOTH EXTRACTION      There were no vitals filed for this visit.       Subjective Assessment - 01/14/17 1535    Subjective Patient reports left trapezius muscle pain is chronic that makes it difficulty to turn her head.    Patient Stated Goals reduction in tension in left trap, increase mobiliy   Currently in Pain? Yes   Pain Score 5   high is 8/10   Pain Location Neck   Pain Orientation Left   Pain Descriptors / Indicators Tightness;Aching   Pain Type Chronic pain   Pain Onset More than a month ago   Pain Frequency Constant   Aggravating Factors  12 hour work day   Pain Relieving Factors arm at side   Multiple Pain Sites Yes   Pain Score 4   Pain Location Shoulder   Pain  Orientation Left   Pain Descriptors / Indicators Aching;Tightness;Tender   Pain Type Chronic pain   Pain Onset More than a month ago   Pain Frequency Intermittent   Aggravating Factors  raise arm overhead, lifting items with left arm   Pain Relieving Factors left arm at side            [facility] PT Assessment - 01/14/17 0001      Assessment   Medical Diagnosis strain of left trapezius muscle, intial encounter   Referring Provider Dr. Karlton Lemon   Onset Date/Surgical Date 08/18/16   Prior Therapy none     Precautions   Precautions None     Restrictions   Weight Bearing Restrictions No     Balance Screen   Has the patient fallen in the past 6 months No   Has the patient had a decrease in activity level because of a fear of falling?  No   Is the patient reluctant to leave their home because of a fear of falling?  No     Home [employer] residence     Prior Function   Level of Independence Independent   Vocation Full time employment   Vocation Requirements use on left ear, lift patients, pull, bending     Cognition   Overall Cognitive Status Within Functional  Limits for tasks assessed     Observation/Other Assessments   Focus on Therapeutic Outcomes (FOTO)  37% limitation  28% limitation     ROM / Strength   AROM / PROM / Strength AROM;Strength     AROM   AROM Assessment Site Cervical   Cervical Flexion decreased by 10%   Cervical Extension decreased by 50%   most movement in suboccipital   Cervical - Right Side Bend full   Cervical - Left Side Bend full   Cervical - Right Rotation decreased by 10%   Cervical - Left Rotation decreased by 10%   Thoracic - Right Rotation decreased by 50%   Thoracic - Left Rotation decreased by 50%     Strength   Left Shoulder Flexion 4/5   Left Shoulder ABduction 4/5   Left Shoulder Internal Rotation 4-/5   Left Shoulder External Rotation 4-/5   Left Shoulder Horizontal ABduction 3/5      Palpation   Spinal mobility decreased right sideglide on C3-C5;    Palpation comment tenderness located on left cervical paraspinals, along left A-C joint            Objective measurements completed on examination: See above findings.          [place] [facility] Adult PT Treatment/Exercise - 01/14/17 0001      Manual Therapy   Manual Therapy Soft tissue mobilization;Joint mobilization   Joint Mobilization sideglide of C3-C6 grade 3   Soft tissue mobilization soft tissue work to left SCM, left scalenes, under left mandible, left subocciptials, left upper trap                PT Education - 01/14/17 1619    Education provided Yes   Education Details stretches for cervical   Person(s) Educated Patient   Methods Explanation;Demonstration;Verbal cues;Handout   Comprehension Returned demonstration;Verbalized understanding          PT Short Term Goals - 01/14/17 1630      PT SHORT TERM GOAL #1   Title independent with initial HEP for flexibility   Time 4   Period Weeks   Status New     PT SHORT TERM GOAL #2   Title end of work day pain is moderate due to reduction in muscle tightness and improved cervical ROM   Time 4   Period Weeks   Status New     PT SHORT TERM GOAL #3   Title ability to lift items with left arm with greater ease >/= 25% due to improve strength of left shoulder and decreased cervical muscle tension   Time 4   Period Weeks   Status New     PT SHORT TERM GOAL #4   Title ability to look behind her with >/= 25% greater ease due to full thoracic ROM   Time 4   Period Weeks   Status New           PT Long Term Goals - 01/14/17 1610      PT LONG TERM GOAL #1   Title independent with HEP    Time 8   Period Weeks   Status New     PT LONG TERM GOAL #2   Title reduction in tension >/= 75% in cervical so she is able to tolerate her work day   Time 8   Period Weeks   Status New     PT LONG TERM GOAL #3   Title full ROM of cervical so she is  able  to perform her work duties with decreased >/= 75% in pain   Time 8   Period Weeks   Status New     PT LONG TERM GOAL #4   Title understand ways to manage her pain, muscle tension and elongation of cervical muscles  incase of relapse   Time 8   Period Weeks   Status New     PT LONG TERM GOAL #5   Title FOTO score </= 28% limitation   Time 8   Period Weeks   Status New                Plan - 01/14/17 1619    Clinical Impression Statement Patient is a 51 year old female with chronic cervical and left shoulder pain.  Patient reports her cervical pain is constant at level 5/10 and left shoulder intermittent pain level is 4/10. Patient cervical pain is worse with talking on the phone with left ear and end of work day.  Left shoulder is worse with raising left arm overhead and lifting items with left arm. Cervical ROM is limited with extension and bilateral rotation.  Left shoulder is full. Left shoulder strength is 4/5 for flexion, abduction, and rotation.  Decreased mobility of C3-C6 sideglide. Palpable tenderness located in bilateral suboccipital, cervical paraspinals, left SCM, left upper trap, and along left A-C joint.  Patient will benefit from skilled therapy to reduce muscle tension of the cervical and left shoulder muscles and improve strength.    History and Personal Factors relevant to plan of care: Left Labrum Repair 2008   Clinical Presentation Stable   Clinical Presentation due to: stiffness in cervical, tightness in left upper trap, left SCM and suboccipitals, Decrease strength of left shoulder   Clinical Decision Making Low   Rehab Potential Excellent   Clinical Impairments Affecting Rehab Potential Left labrum repair 2008   PT Frequency 2x / week   PT Duration 8 weeks   PT Treatment/Interventions Cryotherapy;Electrical Stimulation;Iontophoresis 4mg /ml Dexamethasone;Moist Heat;Ultrasound;Patient/family education;Neuromuscular re-education;Therapeutic  exercise;Therapeutic activities;Manual techniques;Passive range of motion;Dry needling;Taping   PT Next Visit Plan Dry needling to left upper trap, SCM, left RTC, left levator scapulae; Soft tissue work to left cervical, joint mobilization to cervical and upper thoracic; thoracic rotation ROM   PT Home Exercise Plan progress as needed   Recommended Other Services None   Consulted and Agree with Plan of Care Patient      Patient will benefit from skilled therapeutic intervention in order to improve the following deficits and impairments:  Decreased range of motion, Increased fascial restricitons, Increased muscle spasms, Decreased activity tolerance, Pain, Impaired flexibility, Decreased strength, Decreased mobility  Visit Diagnosis: Cervicalgia - Plan: PT plan of care cert/re-cert  Chronic left shoulder pain - Plan: PT plan of care cert/re-cert  Muscle weakness (generalized) - Plan: PT plan of care cert/re-cert  Cramp and spasm - Plan: PT plan of care cert/re-cert     Problem List Patient Active Problem List   Diagnosis Date Noted  . Trapezius muscle strain 12/23/2016  . Left shoulder pain 12/23/2016  . ADHD 11/14/2016  . GERD (gastroesophageal reflux disease) 11/14/2016  . S/P ACL reconstruction 02/09/2014  . Left knee injury 01/03/2014    Earlie Counts, PT 01/14/17 4:36 PM   [facility] [address] [place], [place], [address] Phone: [phone]   Fax:  [phone]  Name: TYRESHIA INGMAN MRN: [id number] Date of Birth: [birth year]

## 2017-01-22 ENCOUNTER — Ambulatory Visit: Payer: 59 | Attending: Family Medicine | Admitting: Physical Therapy

## 2017-01-22 DIAGNOSIS — M25512 Pain in left shoulder: Secondary | ICD-10-CM | POA: Insufficient documentation

## 2017-01-22 DIAGNOSIS — M6281 Muscle weakness (generalized): Secondary | ICD-10-CM | POA: Diagnosis not present

## 2017-01-22 DIAGNOSIS — R252 Cramp and spasm: Secondary | ICD-10-CM | POA: Insufficient documentation

## 2017-01-22 DIAGNOSIS — G8929 Other chronic pain: Secondary | ICD-10-CM | POA: Insufficient documentation

## 2017-01-22 DIAGNOSIS — M542 Cervicalgia: Secondary | ICD-10-CM | POA: Insufficient documentation

## 2017-01-22 NOTE — Patient Instructions (Signed)

## 2017-01-22 NOTE — Therapy (Signed)
[facility] [address] [place], [place], [address] Phone: [phone]   Fax:  [phone]  Physical Therapy Treatment  Patient Details  Name: Melissa Mejia MRN: [id number] Date of Birth: 07-Jun-1966 Referring Provider: Dr. Karlton Lemon  Encounter Date: 01/22/2017      PT End of Session - 01/22/17 1020    Visit Number 2   Date for PT Re-Evaluation 03/11/17   PT Start Time 1020   PT Stop Time 1123   PT Time Calculation (min) 63 min   Activity Tolerance Patient tolerated treatment well   Behavior During Therapy [facility] for tasks assessed/performed      Past Medical History:  Diagnosis Date  . ADD (attention deficit disorder)   . History of esophageal reflux   . Right ACL tear   . Wears contact lenses     Past Surgical History:  Procedure Laterality Date  . KNEE ARTHROSCOPY WITH ANTERIOR CRUCIATE LIGAMENT (ACL) REPAIR WITH HAMSTRING GRAFT Left 02/09/2014   Procedure: LEFT KNEE ARTHROSCOPY WITH ALLOGRAFT ANTERIOR CRUCIATE LIGAMENT (ACL) HAMSTRING RECONSTRUCTION ;  Surgeon: Sydnee Cabal, MD;  Location: [place];  Service: Orthopedics;  Laterality: Left;  . SHOULDER SURGERY Left 2008   labrum repair  . WISDOM TOOTH EXTRACTION      There were no vitals filed for this visit.      Subjective Assessment - 01/22/17 1027    Subjective Pt noting relief of muscle tension in L side of her nexk after last PT session, but now noting more soreness on R.   Patient Stated Goals reduction in tension in left trap, increase mobiliy   Currently in Pain? Yes   Pain Score --  4-5/10   Pain Location Neck   Pain Orientation Right   Pain Descriptors / Indicators Tightness   Pain Type Chronic pain   Pain Onset More than a month ago   Pain Frequency Constant   Pain Onset More than a month ago                         [facility] Adult PT Treatment/Exercise - 01/22/17 1020      Exercises   Exercises Neck     Neck Exercises: Machines for Strengthening   UBE (Upper Arm Bike) L 1.0 fwb/back x 3' each     Modalities   Modalities Moist Heat;Electrical Stimulation     Moist Heat Therapy   Number Minutes Moist Heat 15 Minutes   Moist Heat Location Cervical;Shoulder     Electrical Stimulation   Electrical Stimulation Location B neck & UT   Electrical Stimulation Action IFC   Electrical Stimulation Parameters to tolerance x15'   Electrical Stimulation Goals Pain;Tone     Manual Therapy   Manual Therapy Soft tissue mobilization;Myofascial release   Soft tissue mobilization soft tissue work to R scalenes, B subocciptials, B upper trap & LS   Myofascial Release TRP to R UT          Trigger Point Dry Needling - 01/22/17 1020    Consent Given? Yes   Education Handout Provided Yes   Muscles Treated Upper Body Upper trapezius   Upper Trapezius Response Twitch reponse elicited;Palpable increased muscle length              PT Education - 01/22/17 1118    Education provided Yes   Education Details Dry needling education   Person(s) Educated Patient   Methods Explanation;Handout  Comprehension Verbalized understanding          PT Short Term Goals - 01/22/17 1126      PT SHORT TERM GOAL #1   Title independent with initial HEP for flexibility   Time 4   Period Weeks   Status On-going     PT SHORT TERM GOAL #2   Title end of work day pain is moderate due to reduction in muscle tightness and improved cervical ROM   Time 4   Period Weeks   Status On-going     PT SHORT TERM GOAL #3   Title ability to lift items with left arm with greater ease >/= 25% due to improve strength of left shoulder and decreased cervical muscle tension   Time 4   Period Weeks   Status On-going     PT SHORT TERM GOAL #4   Title ability to look behind her with >/= 25% greater ease due to full thoracic ROM   Time 4   Period Weeks   Status On-going           PT Long Term  Goals - 01/22/17 1126      PT LONG TERM GOAL #1   Title independent with HEP    Time 8   Period Weeks   Status On-going     PT LONG TERM GOAL #2   Title reduction in tension >/= 75% in cervical so she is able to tolerate her work day   Time 8   Period Weeks   Status On-going     PT LONG TERM GOAL #3   Title full ROM of cervical so she is able to perform her work duties with decreased >/= 75% in pain   Time 8   Period Weeks   Status On-going     PT LONG TERM GOAL #4   Title understand ways to manage her pain, muscle tension and elongation of cervical muscles  incase of relapse   Time 8   Period Weeks   Status On-going     PT LONG TERM GOAL #5   Title FOTO score </= 28% limitation   Time 8   Period Weeks   Status On-going               Plan - 01/22/17 1126    Clinical Impression Statement Pt noting good relief from STM to L upper shoulder and neck on last visit, but now noting increased soreness & tension on R. Significant increased tension noted in R UT, LS, and scalenes with multiple trigger points identified in R UT. Dry needling education provided and pt gave verbal consent for treatment. DN initiated to R UT with paplable twitch response and decreased muscle tension noted following treatment. Visit concluded with estim & moist heat to promote furhter muscle relaxation.   Rehab Potential Excellent   Clinical Impairments Affecting Rehab Potential Left labrum repair 2008   PT Frequency 2x / week   PT Duration 8 weeks   PT Treatment/Interventions Cryotherapy;Electrical Stimulation;Iontophoresis 4mg /ml Dexamethasone;Moist Heat;Ultrasound;Patient/family education;Neuromuscular re-education;Therapeutic exercise;Therapeutic activities;Manual techniques;Passive range of motion;Dry needling;Taping   PT Next Visit Plan Dry needling to left upper trap, SCM, left RTC, left levator scapulae; Soft tissue work to left cervical, joint mobilization to cervical and upper thoracic;  thoracic rotation ROM   PT Home Exercise Plan assess reponse to DN, progress as needed   Consulted and Agree with Plan of Care Patient      Patient will benefit from skilled therapeutic intervention  in order to improve the following deficits and impairments:  Decreased range of motion, Increased fascial restricitons, Increased muscle spasms, Decreased activity tolerance, Pain, Impaired flexibility, Decreased strength, Decreased mobility  Visit Diagnosis: Cervicalgia  Chronic left shoulder pain  Muscle weakness (generalized)  Cramp and spasm     Problem List Patient Active Problem List   Diagnosis Date Noted  . Trapezius muscle strain 12/23/2016  . Left shoulder pain 12/23/2016  . ADHD 11/14/2016  . GERD (gastroesophageal reflux disease) 11/14/2016  . S/P ACL reconstruction 02/09/2014  . Left knee injury 01/03/2014    Percival Spanish, PT, MPT 01/22/2017, 11:33 AM  [facility] [address] [place], [place], [address] Phone: [phone]   Fax:  [phone]  Name: SHATAYA WINKLES MRN: [id number] Date of Birth: [birth year]

## 2017-01-27 ENCOUNTER — Ambulatory Visit: Payer: 59 | Admitting: Physical Therapy

## 2017-01-27 DIAGNOSIS — M6281 Muscle weakness (generalized): Secondary | ICD-10-CM

## 2017-01-27 DIAGNOSIS — M25512 Pain in left shoulder: Secondary | ICD-10-CM

## 2017-01-27 DIAGNOSIS — G8929 Other chronic pain: Secondary | ICD-10-CM | POA: Diagnosis not present

## 2017-01-27 DIAGNOSIS — M542 Cervicalgia: Secondary | ICD-10-CM

## 2017-01-27 DIAGNOSIS — R252 Cramp and spasm: Secondary | ICD-10-CM | POA: Diagnosis not present

## 2017-01-27 NOTE — Therapy (Signed)
[facility] [address]  [place], [place], [address] Phone: [phone]   Fax:  [phone]  Physical Therapy Treatment  Patient Details  Name: [place] Mej[place] MRN: [id number] Date of Birth: 1965/10/30 Referring Provider: Dr. Karlton Lemon  Encounter Date: 01/27/2017      PT End of Session - 01/27/17 1535    Visit Number 3   Date for PT Re-Evaluation 03/11/17   PT Start Time 1535   PT Stop Time 1650   PT Time Calculation (min) 75 min   Activity Tolerance Patient tolerated treatment well   Behavior During Therapy [facility] for tasks assessed/performed      Past Medical History:  Diagnosis Date  . ADD (attention deficit disorder)   . History of esophageal reflux   . Right ACL tear   . Wears contact lenses     Past Surgical History:  Procedure Laterality Date  . KNEE ARTHROSCOPY WITH ANTERIOR CRUCIATE LIGAMENT (ACL) REPAIR WITH HAMSTRING GRAFT Left 02/09/2014   Procedure: LEFT KNEE ARTHROSCOPY WITH ALLOGRAFT ANTERIOR CRUCIATE LIGAMENT (ACL) HAMSTRING RECONSTRUCTION ;  Surgeon: Sydnee Cabal, MD;  Location: [place];  Service: Orthopedics;  Laterality: Left;  . SHOULDER SURGERY Left 2008   labrum repair  . WISDOM TOOTH EXTRACTION      There were no vitals filed for this visit.      Subjective Assessment - 01/27/17 1538    Subjective Pt reporting not much soreness from needling until Sat when her husband went to rub her shoulders.   Patient Stated Goals reduction in tension in left trap, increase mobiliy   Currently in Pain? Yes   Pain Score 3    Pain Location Neck  & upper traps   Pain Onset More than a month ago                         [facility] Adult PT Treatment/Exercise - 01/27/17 1535      Neck Exercises: Machines for Strengthening   UBE (Upper Arm Bike) L 2.0 fwb/back x 3' each     Neck Exercises: Theraband   Shoulder External Rotation 15 reps;Red   Shoulder  External Rotation Limitations hooklying on 6" FR   Horizontal ABduction 15 reps;Red  3" hold   Horizontal ABduction Limitations hooklying on 6" FR   Other Theraband Exercises shoulder flexion + opp shoulder extension with red TB 15x3"     Neck Exercises: Seated   Neck Retraction 10 reps;5 secs     Modalities   Modalities Moist Heat;Electrical Stimulation     Moist Heat Therapy   Number Minutes Moist Heat 15 Minutes   Moist Heat Location Cervical;Shoulder     Electrical Stimulation   Electrical Stimulation Location B neck & UT/LS   Electrical Stimulation Action IFC   Electrical Stimulation Parameters to tolerance x15'   Electrical Stimulation Goals Pain;Tone     Manual Therapy   Manual Therapy Soft tissue mobilization;Myofascial release   Soft tissue mobilization soft tissue work to R scalenes, B subocciptials, B upper trap & LS   Myofascial Release TRP to R UT     Neck Exercises: Stretches   Upper Trapezius Stretch 30 seconds;2 reps   Upper Trapezius Stretch Limitations seated with arm behind back x1 & anchored on edge of seat x 1; slight overpressure          Trigger Point Dry Needling - 01/27/17 1648    Consent Given?  Yes   Muscles Treated Upper Body Upper trapezius;Levator scapulae  bilateral L>R   Upper Trapezius Response Twitch reponse elicited;Palpable increased muscle length   Levator Scapulae Response Twitch response elicited;Palpable increased muscle length                PT Short Term Goals - 01/27/17 1701      PT SHORT TERM GOAL #1   Title independent with initial HEP for flexibility by 02/11/17   Time 4   Period Weeks   Status On-going     PT SHORT TERM GOAL #2   Title end of work day pain is moderate due to reduction in muscle tightness and improved cervical ROM   Time 4   Period Weeks   Status On-going     PT SHORT TERM GOAL #3   Title ability to lift items with left arm with greater ease >/= 25% due to improve strength of left shoulder  and decreased cervical muscle tension   Time 4   Period Weeks   Status On-going     PT SHORT TERM GOAL #4   Title ability to look behind her with >/= 25% greater ease due to full thoracic ROM   Time 4   Period Weeks   Status On-going           PT Long Term Goals - 01/27/17 1702      PT LONG TERM GOAL #1   Title independent with HEP by 03/11/17   Time 8   Period Weeks   Status On-going     PT LONG TERM GOAL #2   Title reduction in tension >/= 75% in cervical so she is able to tolerate her work day   Time 8   Period Weeks   Status On-going     PT LONG TERM GOAL #3   Title full ROM of cervical so she is able to perform her work duties with decreased >/= 75% in pain   Time 8   Period Weeks   Status On-going     PT LONG TERM GOAL #4   Title understand ways to manage her pain, muscle tension and elongation of cervical muscles  incase of relapse   Time 8   Period Weeks   Status On-going     PT LONG TERM GOAL #5   Title FOTO score </= 28% limitation   Time 8   Period Weeks   Status On-going               Plan - 01/27/17 1542    Clinical Impression Statement Reviewed home stretches adding bilateral UT stretch, pec stretch, chin tuck and scapular stabilization exercises with good pt tolerance. Pt noting benefit from dry needling but feels like it has left her lopsided as did STM from initial visit, therefore pt requesting bilateral approach today. Reviewed dry needling education including precautions with needling over lung field, with pt instructed to report to ER if any signs/symptoms of pneumothorax present. Twitch repsonse elicted and decreased muscle tensio noted following dry needling.   Rehab Potential Excellent   Clinical Impairments Affecting Rehab Potential Left labrum repair 2008   PT Frequency 2x / week   PT Duration 8 weeks   PT Treatment/Interventions Cryotherapy;Electrical Stimulation;Iontophoresis 4mg /ml Dexamethasone;Moist  Heat;Ultrasound;Patient/family education;Neuromuscular re-education;Therapeutic exercise;Therapeutic activities;Manual techniques;Passive range of motion;Dry needling;Taping   PT Next Visit Plan Dry needling to left upper trap, SCM, left RTC, left levator scapulae; Soft tissue work to left cervical, joint mobilization to cervical and  upper thoracic; thoracic rotation ROM   PT Home Exercise Plan assess reponse to DN, progress as needed   Consulted and Agree with Plan of Care Patient      Patient will benefit from skilled therapeutic intervention in order to improve the following deficits and impairments:  Decreased range of motion, Increased fascial restricitons, Increased muscle spasms, Decreased activity tolerance, Pain, Impaired flexibility, Decreased strength, Decreased mobility  Visit Diagnosis: Cervicalgia  Chronic left shoulder pain  Muscle weakness (generalized)  Cramp and spasm     Problem List Patient Active Problem List   Diagnosis Date Noted  . Trapezius muscle strain 12/23/2016  . Left shoulder pain 12/23/2016  . ADHD 11/14/2016  . GERD (gastroesophageal reflux disease) 11/14/2016  . S/P ACL reconstruction 02/09/2014  . Left knee injury 01/03/2014    Percival Spanish, PT, MPT 01/27/2017, 5:07 PM  [facility] [address]  [place], [place], [address] Phone: [phone]   Fax:  [phone]  Name: [place] Me[place] MRN: [id number] Date of Birth: 1966-06-12

## 2017-01-29 ENCOUNTER — Encounter: Payer: 59 | Admitting: Physical Therapy

## 2017-01-30 ENCOUNTER — Ambulatory Visit: Payer: 59 | Admitting: Physical Therapy

## 2017-01-30 DIAGNOSIS — M542 Cervicalgia: Secondary | ICD-10-CM | POA: Diagnosis not present

## 2017-01-30 DIAGNOSIS — M6281 Muscle weakness (generalized): Secondary | ICD-10-CM

## 2017-01-30 DIAGNOSIS — G8929 Other chronic pain: Secondary | ICD-10-CM | POA: Diagnosis not present

## 2017-01-30 DIAGNOSIS — M25512 Pain in left shoulder: Secondary | ICD-10-CM

## 2017-01-30 DIAGNOSIS — R252 Cramp and spasm: Secondary | ICD-10-CM | POA: Diagnosis not present

## 2017-01-30 NOTE — Therapy (Signed)
[facility] [address]  [place], [place], [address] Phone: [phone]   Fax:  [phone]  Physical Therapy Treatment  Patient Details  Name: [place] Me[place] MRN: [id number] Date of Birth: August 28, 1965 Referring Provider: Dr. Karlton Lemon  Encounter Date: 01/30/2017      PT End of Session - 01/30/17 0852    Visit Number 3   Date for PT Re-Evaluation 03/11/17   PT Start Time 0848   PT Stop Time 0950   PT Time Calculation (min) 62 min   Activity Tolerance Patient tolerated treatment well   Behavior During Therapy [facility] for tasks assessed/performed      Past Medical History:  Diagnosis Date  . ADD (attention deficit disorder)   . History of esophageal reflux   . Right ACL tear   . Wears contact lenses     Past Surgical History:  Procedure Laterality Date  . KNEE ARTHROSCOPY WITH ANTERIOR CRUCIATE LIGAMENT (ACL) REPAIR WITH HAMSTRING GRAFT Left 02/09/2014   Procedure: LEFT KNEE ARTHROSCOPY WITH ALLOGRAFT ANTERIOR CRUCIATE LIGAMENT (ACL) HAMSTRING RECONSTRUCTION ;  Surgeon: Sydnee Cabal, MD;  Location: [place];  Service: Orthopedics;  Laterality: Left;  . SHOULDER SURGERY Left 2008   labrum repair  . WISDOM TOOTH EXTRACTION      There were no vitals filed for this visit.      Subjective Assessment - 01/30/17 0850    Subjective Feels like she is getting good relief from needling, wants to reduce DN treatment to 1x/week   Patient Stated Goals reduction in tension in left trap, increase mobiliy   Currently in Pain? No/denies   Pain Score 0-No pain                         [facility] Adult PT Treatment/Exercise - 01/30/17 0854      Neck Exercises: Machines for Strengthening   UBE (Upper Arm Bike) L3 x 6 minutes (3/3)     Neck Exercises: Theraband   Shoulder External Rotation 15 reps;Green   Shoulder External Rotation Limitations hooklying on 6" FR   Horizontal ABduction 15  reps;Green   Horizontal ABduction Limitations hooklying on 6" FR     Neck Exercises: Sidelying   Other Sidelying Exercise open book 5 x 10 sec each side     Modalities   Modalities Moist Heat;Electrical Stimulation     Moist Heat Therapy   Number Minutes Moist Heat 15 Minutes   Moist Heat Location Cervical;Shoulder     Electrical Stimulation   Electrical Stimulation Location B neck & UT/LS   Electrical Stimulation Action IFC    Electrical Stimulation Parameters to tolerance   Electrical Stimulation Goals Pain;Tone     Manual Therapy   Manual Therapy Soft tissue mobilization;Myofascial release   Manual therapy comments patient supine   Soft tissue mobilization soft tissue work to R scalenes, B subocciptials, B upper trap & LS   Myofascial Release manual trigger point release to R UT and R cervical paraspinals     Neck Exercises: Stretches   Upper Trapezius Stretch 1 rep;60 seconds   Upper Trapezius Stretch Limitations patient supine - PT led                  PT Short Term Goals - 01/27/17 1701      PT SHORT TERM GOAL #1   Title independent with initial HEP for flexibility by 02/11/17   Time 4  Period Weeks   Status On-going     PT SHORT TERM GOAL #2   Title end of work day pain is moderate due to reduction in muscle tightness and improved cervical ROM   Time 4   Period Weeks   Status On-going     PT SHORT TERM GOAL #3   Title ability to lift items with left arm with greater ease >/= 25% due to improve strength of left shoulder and decreased cervical muscle tension   Time 4   Period Weeks   Status On-going     PT SHORT TERM GOAL #4   Title ability to look behind her with >/= 25% greater ease due to full thoracic ROM   Time 4   Period Weeks   Status On-going           PT Long Term Goals - 01/27/17 1702      PT LONG TERM GOAL #1   Title independent with HEP by 03/11/17   Time 8   Period Weeks   Status On-going     PT LONG TERM GOAL #2    Title reduction in tension >/= 75% in cervical so she is able to tolerate her work day   Time 8   Period Weeks   Status On-going     PT LONG TERM GOAL #3   Title full ROM of cervical so she is able to perform her work duties with decreased >/= 75% in pain   Time 8   Period Weeks   Status On-going     PT LONG TERM GOAL #4   Title understand ways to manage her pain, muscle tension and elongation of cervical muscles  incase of relapse   Time 8   Period Weeks   Status On-going     PT LONG TERM GOAL #5   Title FOTO score </= 28% limitation   Time 8   Period Weeks   Status On-going               Plan - 01/30/17 0929    Clinical Impression Statement Patient reporting good relief from DN at last visit. Wants to defer DN treatment today to work on more manual therapy as well as scapualr stabilization. Patient with continued tightness of B UT and cervical paraspinals with trigger points noted throughout.    PT Treatment/Interventions Cryotherapy;Electrical Stimulation;Iontophoresis 4mg /ml Dexamethasone;Moist Heat;Ultrasound;Patient/family education;Neuromuscular re-education;Therapeutic exercise;Therapeutic activities;Manual techniques;Passive range of motion;Dry needling;Taping   PT Next Visit Plan Dry needling to left upper trap, SCM, left RTC, left levator scapulae; Soft tissue work to left cervical, joint mobilization to cervical and upper thoracic; thoracic rotation ROM   PT Home Exercise Plan assess reponse to DN, progress as needed   Consulted and Agree with Plan of Care Patient      Patient will benefit from skilled therapeutic intervention in order to improve the following deficits and impairments:  Decreased range of motion, Increased fascial restricitons, Increased muscle spasms, Decreased activity tolerance, Pain, Impaired flexibility, Decreased strength, Decreased mobility  Visit Diagnosis: Cervicalgia  Chronic left shoulder pain  Muscle weakness  (generalized)  Cramp and spasm     Problem List Patient Active Problem List   Diagnosis Date Noted  . Trapezius muscle strain 12/23/2016  . Left shoulder pain 12/23/2016  . ADHD 11/14/2016  . GERD (gastroesophageal reflux disease) 11/14/2016  . S/P ACL reconstruction 02/09/2014  . Left knee injury 01/03/2014     Lanney Gins, PT, DPT 01/30/17 10:24 AM  [facility] [address]  [place], [place], [address] Phone: [phone]   Fax:  [phone]  Name: DARINA HARTWELL MRN: [id number] Date of Birth: 1966/08/01

## 2017-02-03 ENCOUNTER — Ambulatory Visit: Payer: 59 | Admitting: Physical Therapy

## 2017-02-03 DIAGNOSIS — G8929 Other chronic pain: Secondary | ICD-10-CM

## 2017-02-03 DIAGNOSIS — M542 Cervicalgia: Secondary | ICD-10-CM | POA: Diagnosis not present

## 2017-02-03 DIAGNOSIS — R252 Cramp and spasm: Secondary | ICD-10-CM | POA: Diagnosis not present

## 2017-02-03 DIAGNOSIS — M25512 Pain in left shoulder: Secondary | ICD-10-CM | POA: Diagnosis not present

## 2017-02-03 DIAGNOSIS — M6281 Muscle weakness (generalized): Secondary | ICD-10-CM

## 2017-02-03 NOTE — Therapy (Signed)
[facility] [address]  [place], [place], [address] Phone: [phone]   Fax:  [phone]  Physical Therapy Treatment  Patient Details  Name: Melissa Mejia MRN: [id number] Date of Birth: May 30, 1966 Referring Provider: Dr. Karlton Lemon  Encounter Date: 02/03/2017      PT End of Session - 02/03/17 0806    Visit Number 5   Date for PT Re-Evaluation 03/11/17   PT Start Time 0803   PT Stop Time 0859   PT Time Calculation (min) 56 min   Activity Tolerance Patient tolerated treatment well   Behavior During Therapy [facility] for tasks assessed/performed      Past Medical History:  Diagnosis Date  . ADD (attention deficit disorder)   . History of esophageal reflux   . Right ACL tear   . Wears contact lenses     Past Surgical History:  Procedure Laterality Date  . KNEE ARTHROSCOPY WITH ANTERIOR CRUCIATE LIGAMENT (ACL) REPAIR WITH HAMSTRING GRAFT Left 02/09/2014   Procedure: LEFT KNEE ARTHROSCOPY WITH ALLOGRAFT ANTERIOR CRUCIATE LIGAMENT (ACL) HAMSTRING RECONSTRUCTION ;  Surgeon: Sydnee Cabal, MD;  Location: [place];  Service: Orthopedics;  Laterality: Left;  . SHOULDER SURGERY Left 2008   labrum repair  . WISDOM TOOTH EXTRACTION      There were no vitals filed for this visit.      Subjective Assessment - 02/03/17 0804    Subjective Pt feeling like therapy is helping. Able to work 12 hr shift yesterday with less shoulder/neck tension noted at end of shift.   Patient Stated Goals reduction in tension in left trap, increase mobiliy   Currently in Pain? No/denies                         [facility] Adult PT Treatment/Exercise - 02/03/17 0803      Neck Exercises: Machines for Strengthening   UBE (Upper Arm Bike) L 3.0 fwb/back x 3' each     Modalities   Modalities Moist Heat;Electrical Stimulation     Moist Heat Therapy   Number Minutes Moist Heat 10 Minutes   Moist Heat  Location Cervical;Shoulder     Electrical Stimulation   Electrical Stimulation Location B neck & UT/LS   Electrical Stimulation Action IFC   Electrical Stimulation Parameters to tolerance x10'   Electrical Stimulation Goals Pain;Tone     Manual Therapy   Manual Therapy Soft tissue mobilization;Myofascial release;Manual Traction   Soft tissue mobilization soft tissue work to B scalenes, B subocciptials, B upper trap & LS   Myofascial Release subocciptal release   Manual Traction cervical distraction in slight flexion 30" x 5          Trigger Point Dry Needling - 02/03/17 0803    Consent Given? Yes   Muscles Treated Upper Body Upper trapezius;Levator scapulae  B cervical multifidi   Upper Trapezius Response Twitch reponse elicited;Palpable increased muscle length   Levator Scapulae Response Twitch response elicited;Palpable increased muscle length                PT Short Term Goals - 02/03/17 0858      PT SHORT TERM GOAL #1   Title independent with initial HEP for flexibility by 02/11/17   Time 4   Period Weeks   Status Achieved     PT SHORT TERM GOAL #2   Title end of work day pain is moderate due to reduction in muscle tightness  and improved cervical ROM by 02/11/17   Time 4   Period Weeks   Status On-going     PT SHORT TERM GOAL #3   Title ability to lift items with left arm with greater ease >/= 25% due to improve strength of left shoulder and decreased cervical muscle tension by 02/11/17   Time 4   Period Weeks   Status On-going     PT SHORT TERM GOAL #4   Title ability to look behind her with >/= 25% greater ease due to full thoracic ROM by 02/11/17   Time 4   Period Weeks   Status On-going           PT Long Term Goals - 01/27/17 1702      PT LONG TERM GOAL #1   Title independent with HEP by 03/11/17   Time 8   Period Weeks   Status On-going     PT LONG TERM GOAL #2   Title reduction in tension >/= 75% in cervical so she is able to tolerate  her work day   Time 8   Period Weeks   Status On-going     PT LONG TERM GOAL #3   Title full ROM of cervical so she is able to perform her work duties with decreased >/= 75% in pain   Time 8   Period Weeks   Status On-going     PT LONG TERM GOAL #4   Title understand ways to manage her pain, muscle tension and elongation of cervical muscles  incase of relapse   Time 8   Period Weeks   Status On-going     PT LONG TERM GOAL #5   Title FOTO score </= 28% limitation   Time 8   Period Weeks   Status On-going               Plan - 02/03/17 0806    Clinical Impression Statement Pt noting benefit from PT with decreasing muscle tension even after working 12 hour shifts. Continued increased muscle tension noted in B cervical paraspinals, UT & LS as well as suboccipitals. DN performed again today with good twitch response elicited followed by decreased muscle tension. DN to suboccipitals deferred today as pt w/o headache currently and has a busy day, but pt would like to attempt this area on a future visit.   Rehab Potential Excellent   Clinical Impairments Affecting Rehab Potential Left labrum repair 2008   PT Treatment/Interventions Cryotherapy;Electrical Stimulation;Iontophoresis 4mg /ml Dexamethasone;Moist Heat;Ultrasound;Patient/family education;Neuromuscular re-education;Therapeutic exercise;Therapeutic activities;Manual techniques;Passive range of motion;Dry needling;Taping   PT Next Visit Plan Dry needling to left upper trap, SCM, left RTC, left levator scapulae +/- suboccipitals; Soft tissue work to left cervical, joint mobilization to cervical and upper thoracic; thoracic rotation ROM   PT Home Exercise Plan assess reponse to DN, progress as needed   Consulted and Agree with Plan of Care Patient      Patient will benefit from skilled therapeutic intervention in order to improve the following deficits and impairments:  Decreased range of motion, Increased fascial restricitons,  Increased muscle spasms, Decreased activity tolerance, Pain, Impaired flexibility, Decreased strength, Decreased mobility  Visit Diagnosis: Cervicalgia  Chronic left shoulder pain  Muscle weakness (generalized)  Cramp and spasm     Problem List Patient Active Problem List   Diagnosis Date Noted  . Trapezius muscle strain 12/23/2016  . Left shoulder pain 12/23/2016  . ADHD 11/14/2016  . GERD (gastroesophageal reflux disease) 11/14/2016  . S/P  ACL reconstruction 02/09/2014  . Left knee injury 01/03/2014    Percival Spanish, PT, MPT 02/03/2017, 10:06 AM  [facility] [address]  [place], [place], [address] Phone: [phone]   Fax:  [phone]  Name: KEYERA HATTABAUGH MRN: [id number] Date of Birth: February 25, 1966

## 2017-02-06 ENCOUNTER — Ambulatory Visit: Payer: 59 | Admitting: Physical Therapy

## 2017-02-06 DIAGNOSIS — M542 Cervicalgia: Secondary | ICD-10-CM | POA: Diagnosis not present

## 2017-02-06 DIAGNOSIS — M6281 Muscle weakness (generalized): Secondary | ICD-10-CM | POA: Diagnosis not present

## 2017-02-06 DIAGNOSIS — G8929 Other chronic pain: Secondary | ICD-10-CM

## 2017-02-06 DIAGNOSIS — R252 Cramp and spasm: Secondary | ICD-10-CM | POA: Diagnosis not present

## 2017-02-06 DIAGNOSIS — M25512 Pain in left shoulder: Secondary | ICD-10-CM | POA: Diagnosis not present

## 2017-02-06 NOTE — Therapy (Signed)
[facility] [address]  [place], [place], [address] Phone: [phone]   Fax:  [phone]  Physical Therapy Treatment  Patient Details  Name: Melissa Mejia MRN: [id number] Date of Birth: 03/04/1966 Referring Provider: Dr. Karlton Lemon  Encounter Date: 02/06/2017      PT End of Session - 02/06/17 0924    Visit Number 6   Date for PT Re-Evaluation 03/11/17   PT Start Time 0942  pt arrived late   PT Stop Time 1042   PT Time Calculation (min) 60 min   Activity Tolerance Patient tolerated treatment well   Behavior During Therapy [facility] for tasks assessed/performed      Past Medical History:  Diagnosis Date  . ADD (attention deficit disorder)   . History of esophageal reflux   . Right ACL tear   . Wears contact lenses     Past Surgical History:  Procedure Laterality Date  . KNEE ARTHROSCOPY WITH ANTERIOR CRUCIATE LIGAMENT (ACL) REPAIR WITH HAMSTRING GRAFT Left 02/09/2014   Procedure: LEFT KNEE ARTHROSCOPY WITH ALLOGRAFT ANTERIOR CRUCIATE LIGAMENT (ACL) HAMSTRING RECONSTRUCTION ;  Surgeon: Sydnee Cabal, MD;  Location: [place];  Service: Orthopedics;  Laterality: Left;  . SHOULDER SURGERY Left 2008   labrum repair  . WISDOM TOOTH EXTRACTION      There were no vitals filed for this visit.      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   Patient Stated Goals reduction in tension in left trap, increase mobiliy   Currently in Pain? Yes   Pain Score 2    Pain Location Neck  & upper traps   Pain Orientation Upper;Mid;Lower   Pain Descriptors / Indicators Tender;Tightness                         [facility] Adult PT Treatment/Exercise - 02/06/17 0942      Neck Exercises: Machines for Strengthening   UBE (Upper Arm Bike) L 3.0 fwb/back x 3' each     Modalities   Modalities Moist  Heat;Electrical Stimulation     Moist Heat Therapy   Number Minutes Moist Heat 15 Minutes   Moist Heat Location Cervical;Shoulder     Electrical Stimulation   Electrical Stimulation Location B neck & UT/LS   Electrical Stimulation Action IFC   Electrical Stimulation Parameters to tolerance x15'   Electrical Stimulation Goals Pain;Tone     Manual Therapy   Manual Therapy Soft tissue mobilization;Myofascial release;Manual Traction;Joint mobilization   Manual therapy comments pt prone & supine   Joint Mobilization CPA of C3-T1, grade 3   Soft tissue mobilization soft tissue work to B scalenes, B subocciptials, B upper trap & LS   Myofascial Release subocciptal release   Manual Traction cervical distraction in slight flexion 30" x 5          Trigger Point Dry Needling - 02/06/17 0942    Consent Given? Yes   Muscles Treated Upper Body Suboccipitals muscle group;Oblique capitus  splenius capitus/cervicus   Oblique Capitus Response Twitch response elicited;Palpable increased muscle length  splenius capitus/cervicus   SubOccipitals Response Twitch response elicited;Palpable increased muscle length                PT Short Term Goals - 02/03/17 1962      PT SHORT TERM GOAL #1   Title independent with initial  HEP for flexibility by 02/11/17   Time 4   Period Weeks   Status Achieved     PT SHORT TERM GOAL #2   Title end of work day pain is moderate due to reduction in muscle tightness and improved cervical ROM by 02/11/17   Time 4   Period Weeks   Status On-going     PT SHORT TERM GOAL #3   Title ability to lift items with left arm with greater ease >/= 25% due to improve strength of left shoulder and decreased cervical muscle tension by 02/11/17   Time 4   Period Weeks   Status On-going     PT SHORT TERM GOAL #4   Title ability to look behind her with >/= 25% greater ease due to full thoracic ROM by 02/11/17   Time 4   Period Weeks   Status On-going            PT Long Term Goals - 01/27/17 1702      PT LONG TERM GOAL #1   Title independent with HEP by 03/11/17   Time 8   Period Weeks   Status On-going     PT LONG TERM GOAL #2   Title reduction in tension >/= 75% in cervical so she is able to tolerate her work day   Time 8   Period Weeks   Status On-going     PT LONG TERM GOAL #3   Title full ROM of cervical so she is able to perform her work duties with decreased >/= 75% in pain   Time 8   Period Weeks   Status On-going     PT LONG TERM GOAL #4   Title understand ways to manage her pain, muscle tension and elongation of cervical muscles  incase of relapse   Time 8   Period Weeks   Status On-going     PT LONG TERM GOAL #5   Title FOTO score </= 28% limitation   Time 8   Period Weeks   Status On-going               Plan - 02/06/17 0947    Clinical Impression Statement Pt noting increased all-over soreness, esp in lower body, after [employer] training for the ED yesterday, but states neck/shoulder pain no more intense than rest of body. Pt reporting headache last night and this morning with continued increased tension and TPs in suboccipitals and cervical paraspinals, therefore performed DN and STM to these muscles with positive twitch repsonse elicited and decreased muscle tension following treatment. Will plan to progress strengthening/stabilization program over upcoming visits.   Rehab Potential Excellent   Clinical Impairments Affecting Rehab Potential Left labrum repair 2008   PT Treatment/Interventions Cryotherapy;Electrical Stimulation;Iontophoresis 4mg /ml Dexamethasone;Moist Heat;Ultrasound;Patient/family education;Neuromuscular re-education;Therapeutic exercise;Therapeutic activities;Manual techniques;Passive range of motion;Dry needling;Taping   PT Next Visit Plan Dry needling to left upper trap, SCM, left RTC, left levator scapulae +/- suboccipitals; Soft tissue work to left cervical, joint mobilization to cervical and  upper thoracic; thoracic rotation ROM   PT Home Exercise Plan assess reponse to DN, progress as needed   Consulted and Agree with Plan of Care Patient      Patient will benefit from skilled therapeutic intervention in order to improve the following deficits and impairments:  Decreased range of motion, Increased fascial restricitons, Increased muscle spasms, Decreased activity tolerance, Pain, Impaired flexibility, Decreased strength, Decreased mobility  Visit Diagnosis: Cervicalgia  Chronic left shoulder pain  Muscle weakness (generalized)  Cramp and spasm     Problem List Patient Active Problem List   Diagnosis Date Noted  . Trapezius muscle strain 12/23/2016  . Left shoulder pain 12/23/2016  . ADHD 11/14/2016  . GERD (gastroesophageal reflux disease) 11/14/2016  . S/P ACL reconstruction 02/09/2014  . Left knee injury 01/03/2014    Percival Spanish, PT, MPT 02/06/2017, 12:19 PM  [facility] [address]  [place], [place], [address] Phone: [phone]   Fax:  [phone]  Name: Melissa Mejia MRN: [id number] Date of Birth: 01-11-1966

## 2017-02-09 ENCOUNTER — Ambulatory Visit: Payer: 59 | Admitting: Physical Therapy

## 2017-02-09 DIAGNOSIS — M542 Cervicalgia: Secondary | ICD-10-CM | POA: Diagnosis not present

## 2017-02-09 DIAGNOSIS — R252 Cramp and spasm: Secondary | ICD-10-CM

## 2017-02-09 DIAGNOSIS — M25512 Pain in left shoulder: Secondary | ICD-10-CM | POA: Diagnosis not present

## 2017-02-09 DIAGNOSIS — G8929 Other chronic pain: Secondary | ICD-10-CM | POA: Diagnosis not present

## 2017-02-09 DIAGNOSIS — M6281 Muscle weakness (generalized): Secondary | ICD-10-CM

## 2017-02-09 NOTE — Therapy (Signed)
[facility] [address]  [place], [place], [address] Phone: [phone]   Fax:  [phone]  Physical Therapy Treatment  Patient Details  Name: Melissa Mejia MRN: [id number] Date of Birth: November 05, 1965 Referring Provider: Dr. Karlton Lemon  Encounter Date: 02/09/2017      PT End of Session - 02/09/17 0933    Visit Number 7   Date for PT Re-Evaluation 03/11/17   PT Start Time 0935   PT Stop Time 1047   PT Time Calculation (min) 72 min   Activity Tolerance Patient tolerated treatment well   Behavior During Therapy [facility] for tasks assessed/performed      Past Medical History:  Diagnosis Date  . ADD (attention deficit disorder)   . History of esophageal reflux   . Right ACL tear   . Wears contact lenses     Past Surgical History:  Procedure Laterality Date  . KNEE ARTHROSCOPY WITH ANTERIOR CRUCIATE LIGAMENT (ACL) REPAIR WITH HAMSTRING GRAFT Left 02/09/2014   Procedure: LEFT KNEE ARTHROSCOPY WITH ALLOGRAFT ANTERIOR CRUCIATE LIGAMENT (ACL) HAMSTRING RECONSTRUCTION ;  Surgeon: Sydnee Cabal, MD;  Location: [place];  Service: Orthopedics;  Laterality: Left;  . SHOULDER SURGERY Left 2008   labrum repair  . WISDOM TOOTH EXTRACTION      There were no vitals filed for this visit.      Subjective Assessment - 02/09/17 0934    Subjective Pt reports she spent the day yesterday cleaning and moving furniture. Still sore from [facility] training.   Patient Stated Goals reduction in tension in left trap, increase mobiliy   Pain Score 4    Pain Location Neck  upper thoracic/shoulder   Pain Orientation Left   Pain Descriptors / Indicators Tender;Tightness                         [facility] Adult PT Treatment/Exercise - 02/09/17 0933      Neck Exercises: Theraband   Shoulder External Rotation 15 reps;Green   Shoulder External Rotation Limitations hooklying on 6" FR   Horizontal ABduction 15  reps;Green   Horizontal ABduction Limitations hooklying on 6" FR   Other Theraband Exercises shoulder flexion + opp shoulder extension with green TB, hooklying on 6" FR 15x3"     Neck Exercises: Sidelying   Other Sidelying Exercise open book 5 x 10 sec each side     Neck Exercises: Prone   W Back 10 reps   W Back Limitations over green Pball   Shoulder Extension 10 reps   Shoulder Extension Limitations "I's" over green Pball   Upper Extremity Flexion with Stabilization 10 reps   UE Flexion with Stabilization Limitations "Y's" over green Pball   Other Prone Exercise quadruped thread & reach x10 each   Other Prone Exercise "T's" over green Pball 10x3"     Manual Therapy   Manual Therapy Soft tissue mobilization;Myofascial release;Manual Traction;Passive ROM   Manual therapy comments pt prone & supine   Soft tissue mobilization soft tissue work to B scalenes, B subocciptials, B upper trap & LS   Myofascial Release manual TPR to R UT, subocciptal release   Passive ROM cervical ROM all directions with gentle stretch   Manual Traction cervical distraction in slight flexion 30" x 5     Neck Exercises: Stretches   Corner Stretch 30 seconds;3 reps   Chest Stretch Limitations 3 way doorway stretch  PT Short Term Goals - 02/03/17 0858      PT SHORT TERM GOAL #1   Title independent with initial HEP for flexibility by 02/11/17   Time 4   Period Weeks   Status Achieved     PT SHORT TERM GOAL #2   Title end of work day pain is moderate due to reduction in muscle tightness and improved cervical ROM by 02/11/17   Time 4   Period Weeks   Status On-going     PT SHORT TERM GOAL #3   Title ability to lift items with left arm with greater ease >/= 25% due to improve strength of left shoulder and decreased cervical muscle tension by 02/11/17   Time 4   Period Weeks   Status On-going     PT SHORT TERM GOAL #4   Title ability to look behind her with >/= 25% greater  ease due to full thoracic ROM by 02/11/17   Time 4   Period Weeks   Status On-going           PT Long Term Goals - 01/27/17 1702      PT LONG TERM GOAL #1   Title independent with HEP by 03/11/17   Time 8   Period Weeks   Status On-going     PT LONG TERM GOAL #2   Title reduction in tension >/= 75% in cervical so she is able to tolerate her work day   Time 8   Period Weeks   Status On-going     PT LONG TERM GOAL #3   Title full ROM of cervical so she is able to perform her work duties with decreased >/= 75% in pain   Time 8   Period Weeks   Status On-going     PT LONG TERM GOAL #4   Title understand ways to manage her pain, muscle tension and elongation of cervical muscles  incase of relapse   Time 8   Period Weeks   Status On-going     PT LONG TERM GOAL #5   Title FOTO score </= 28% limitation   Time 8   Period Weeks   Status On-going               Plan - 02/09/17 1610    Clinical Impression Statement Pt reporting improving awareness of posture at home and work, but continues to expereience increased pain and muscle tension throughout cervical, upper thoracic and upper shoudler regions with continued TPs. Treatment today focused on improving cervical and thoracic flexibility along with scapular strengthening to provide improved stabilization. Treatment concluded with manual therapy with significant TP noted in R UT which was slow to respond to manual TPR and may benefit from DN at next visit if not resolved.   Rehab Potential Excellent   Clinical Impairments Affecting Rehab Potential Left labrum repair 2008   PT Treatment/Interventions Cryotherapy;Electrical Stimulation;Iontophoresis 4mg /ml Dexamethasone;Moist Heat;Ultrasound;Patient/family education;Neuromuscular re-education;Therapeutic exercise;Therapeutic activities;Manual techniques;Passive range of motion;Dry needling;Taping   PT Next Visit Plan Dry needling to left upper trap, SCM, left RTC, left levator  scapulae +/- suboccipitals; Soft tissue work to left cervical, joint mobilization to cervical and upper thoracic; thoracic rotation ROM   PT Home Exercise Plan assess reponse to DN, progress as needed   Consulted and Agree with Plan of Care Patient      Patient will benefit from skilled therapeutic intervention in order to improve the following deficits and impairments:  Decreased range of motion, Increased fascial restricitons,  Increased muscle spasms, Decreased activity tolerance, Pain, Impaired flexibility, Decreased strength, Decreased mobility  Visit Diagnosis: Cervicalgia  Chronic left shoulder pain  Muscle weakness (generalized)  Cramp and spasm     Problem List Patient Active Problem List   Diagnosis Date Noted  . Trapezius muscle strain 12/23/2016  . Left shoulder pain 12/23/2016  . ADHD 11/14/2016  . GERD (gastroesophageal reflux disease) 11/14/2016  . S/P ACL reconstruction 02/09/2014  . Left knee injury 01/03/2014    Percival Spanish, PT, MPT 02/09/2017, 10:51 AM  [facility] [address]  [place], [place], [address] Phone: [phone]   Fax:  [phone]  Name: Melissa Mejia MRN: [id number] Date of Birth: 10-23-1965

## 2017-02-12 ENCOUNTER — Ambulatory Visit: Payer: 59 | Admitting: Physical Therapy

## 2017-02-12 DIAGNOSIS — M542 Cervicalgia: Secondary | ICD-10-CM

## 2017-02-12 DIAGNOSIS — G8929 Other chronic pain: Secondary | ICD-10-CM | POA: Diagnosis not present

## 2017-02-12 DIAGNOSIS — M6281 Muscle weakness (generalized): Secondary | ICD-10-CM

## 2017-02-12 DIAGNOSIS — M25512 Pain in left shoulder: Secondary | ICD-10-CM | POA: Diagnosis not present

## 2017-02-12 DIAGNOSIS — R252 Cramp and spasm: Secondary | ICD-10-CM | POA: Diagnosis not present

## 2017-02-12 MED FILL — ADDERALL XR 15 MG CAP SA: 15 | 30 days supply | Qty: 30 | Fill #0

## 2017-02-12 MED FILL — DEXTROAMP-AMPHETAMIN 20 MG: 20 | 30 days supply | Qty: 30 | Fill #0

## 2017-02-12 NOTE — Therapy (Signed)
[facility] [address]  [place], [place], [address] Phone: [phone]   Fax:  [phone]  Physical Therapy Treatment  Patient Details  Name: Melissa Mejia MRN: [id number] Date of Birth: [birth year] Referring Provider: Dr. Karlton Lemon  Encounter Date: 02/12/2017      PT End of Session - 02/12/17 0936    Visit Number 8   Date for PT Re-Evaluation 03/11/17   PT Start Time 0936   PT Stop Time 1038   PT Time Calculation (min) 62 min   Activity Tolerance Patient tolerated treatment well   Behavior During Therapy [facility] for tasks assessed/performed      Past Medical History:  Diagnosis Date  . ADD (attention deficit disorder)   . History of esophageal reflux   . Right ACL tear   . Wears contact lenses     Past Surgical History:  Procedure Laterality Date  . KNEE ARTHROSCOPY WITH ANTERIOR CRUCIATE LIGAMENT (ACL) REPAIR WITH HAMSTRING GRAFT Left 02/09/2014   Procedure: LEFT KNEE ARTHROSCOPY WITH ALLOGRAFT ANTERIOR CRUCIATE LIGAMENT (ACL) HAMSTRING RECONSTRUCTION ;  Surgeon: Sydnee Cabal, MD;  Location: [facility];  Service: Orthopedics;  Laterality: Left;  . SHOULDER SURGERY Left 2008   labrum repair  . WISDOM TOOTH EXTRACTION      There were no vitals filed for this visit.      Subjective Assessment - 02/12/17 0940    Subjective Pt reports this week has been a very busy week in the ED. Noting increased L sided lateral neck pain over the past 2 days - thinks she may have slept wrong.   Patient Stated Goals reduction in tension in left trap, increase mobiliy   Currently in Pain? Yes   Pain Score --  1-2/10   Pain Location Neck   Pain Orientation Left   Pain Descriptors / Indicators Tender;Tightness                         [facility] Adult PT Treatment/Exercise - 02/12/17 0936      Neck Exercises: Machines for Strengthening   UBE (Upper Arm Bike) L 3.0 fwb/back x 3' each      Neck Exercises: Theraband   Shoulder External Rotation 15 reps;Green   Shoulder External Rotation Limitations standing against inside of doorframe   Horizontal ABduction 15 reps;Green   Horizontal ABduction Limitations standing against inside of doorframe   Other Theraband Exercises alt shoulder flexion + opp shoulder extension with green TB, standing against inside of doorframe 15x3"     Modalities   Modalities Moist Heat;Electrical Stimulation     Moist Heat Therapy   Number Minutes Moist Heat 15 Minutes   Moist Heat Location Cervical;Shoulder     Electrical Stimulation   Electrical Stimulation Location B neck & UT/LS   Electrical Stimulation Action IFC   Electrical Stimulation Parameters to tolerance x15'   Electrical Stimulation Goals Pain;Tone     Manual Therapy   Manual Therapy Soft tissue mobilization;Joint mobilization;Passive ROM   Joint Mobilization L 1st & 2nd rib PA mobs, grade 2-3   Soft tissue mobilization soft tissue work to B scalenes, B subocciptials, B upper trap & LS   Passive ROM cervical ROM all directions with gentle stretch          Trigger Point Dry Needling - 02/12/17 0936    Consent Given? Yes   Muscles Treated Upper Body Upper trapezius   Upper Trapezius Response  Twitch reponse elicited;Palpable increased muscle length  Bil                PT Short Term Goals - 02/12/17 0944      PT SHORT TERM GOAL #1   Title independent with initial HEP for flexibility by 02/11/17   Time 4   Period Weeks   Status Achieved     PT SHORT TERM GOAL #2   Title end of work day pain is moderate due to reduction in muscle tightness and improved cervical ROM by 02/11/17   Time 4   Period Weeks   Status Achieved     PT SHORT TERM GOAL #3   Title ability to lift items with left arm with greater ease >/= 25% due to improve strength of left shoulder and decreased cervical muscle tension by 02/11/17   Time 4   Period Weeks   Status Achieved     PT SHORT  TERM GOAL #4   Title ability to look behind her with >/= 25% greater ease due to full thoracic ROM by 02/11/17   Time 4   Period Weeks   Status Achieved           PT Long Term Goals - 01/27/17 1702      PT LONG TERM GOAL #1   Title independent with HEP by 03/11/17   Time 8   Period Weeks   Status On-going     PT LONG TERM GOAL #2   Title reduction in tension >/= 75% in cervical so she is able to tolerate her work day   Time 8   Period Weeks   Status On-going     PT LONG TERM GOAL #3   Title full ROM of cervical so she is able to perform her work duties with decreased >/= 75% in pain   Time 8   Period Weeks   Status On-going     PT LONG TERM GOAL #4   Title understand ways to manage her pain, muscle tension and elongation of cervical muscles  incase of relapse   Time 8   Period Weeks   Status On-going     PT LONG TERM GOAL #5   Title FOTO score </= 28% limitation   Time 8   Period Weeks   Status On-going               Plan - 02/12/17 0947    Clinical Impression Statement Pt inquiring about alterantive ways of performing UE theraband exercises such that she could perform them on breaks at work to help relieve the tension while at work, therefore instucted pt in standing versions of these exercises using the inside of a doorframe as a promt for scapular activation in place of the foam roll. R UT trigger point remains hyper-irritiable today with pt also noting increased pain/tightness in L anterior scalenes +/- SCM over past 2 days, therefore targeted manual therapy, DN and stretching to address these areas with good relief noted.    Rehab Potential Excellent   Clinical Impairments Affecting Rehab Potential Left labrum repair 2008   PT Treatment/Interventions Cryotherapy;Electrical Stimulation;Iontophoresis 4mg /ml Dexamethasone;Moist Heat;Ultrasound;Patient/family education;Neuromuscular re-education;Therapeutic exercise;Therapeutic activities;Manual  techniques;Passive range of motion;Dry needling;Taping   PT Next Visit Plan F/u re: insurance coverage for TENS/IT unit; Dry needling to left upper trap, SCM, left RTC, left levator scapulae +/- suboccipitals; Soft tissue work to left cervical, joint mobilization to cervical and upper thoracic; thoracic rotation ROM   PT Home Exercise Plan  progress as needed   Consulted and Agree with Plan of Care Patient      Patient will benefit from skilled therapeutic intervention in order to improve the following deficits and impairments:  Decreased range of motion, Increased fascial restricitons, Increased muscle spasms, Decreased activity tolerance, Pain, Impaired flexibility, Decreased strength, Decreased mobility  Visit Diagnosis: Cervicalgia  Chronic left shoulder pain  Muscle weakness (generalized)  Cramp and spasm     Problem List Patient Active Problem List   Diagnosis Date Noted  . Trapezius muscle strain 12/23/2016  . Left shoulder pain 12/23/2016  . ADHD 11/14/2016  . GERD (gastroesophageal reflux disease) 11/14/2016  . S/P ACL reconstruction 02/09/2014  . Left knee injury 01/03/2014    Percival Spanish, PT, MPT 02/12/2017, 3:05 PM  [facility] [address]  [place], [place], [address] Phone: [phone]   Fax:  [phone]  Name: SRUTI AYLLON MRN: [id number] Date of Birth: 1965/10/14

## 2017-02-20 ENCOUNTER — Encounter: Payer: 59 | Admitting: Physical Therapy

## 2017-03-02 DIAGNOSIS — S46819A Strain of other muscles, fascia and tendons at shoulder and upper arm level, unspecified arm, initial encounter: Secondary | ICD-10-CM | POA: Diagnosis not present

## 2017-03-10 ENCOUNTER — Ambulatory Visit: Payer: 59 | Attending: Family Medicine | Admitting: Physical Therapy

## 2017-03-10 DIAGNOSIS — M25512 Pain in left shoulder: Secondary | ICD-10-CM | POA: Diagnosis not present

## 2017-03-10 DIAGNOSIS — R252 Cramp and spasm: Secondary | ICD-10-CM | POA: Insufficient documentation

## 2017-03-10 DIAGNOSIS — M542 Cervicalgia: Secondary | ICD-10-CM | POA: Insufficient documentation

## 2017-03-10 DIAGNOSIS — H52222 Regular astigmatism, left eye: Secondary | ICD-10-CM | POA: Diagnosis not present

## 2017-03-10 DIAGNOSIS — H5213 Myopia, bilateral: Secondary | ICD-10-CM | POA: Diagnosis not present

## 2017-03-10 DIAGNOSIS — G8929 Other chronic pain: Secondary | ICD-10-CM | POA: Insufficient documentation

## 2017-03-10 DIAGNOSIS — M6281 Muscle weakness (generalized): Secondary | ICD-10-CM | POA: Insufficient documentation

## 2017-03-10 NOTE — Patient Instructions (Signed)
TENS stands for Transcutaneous Electrical Nerve Stimulation. In other words, electrical impulses are allowed to pass through the skin in order to excite a nerve.   Purpose and Use of TENS:  TENS is a method used to manage acute and chronic pain without the use of drugs. It has been effective in managing pain associated with surgery, sprains, strains, trauma, rheumatoid arthritis, and neuralgias. It is a non-addictive, low risk, and non-invasive technique used to control pain. It is not, by any means, a curative form of treatment.   How TENS Works:  Most TENS units are a small pocket-sized unit powered by one 9 volt battery. Attached to the outside of the unit are two lead wires where two pins and/or snaps connect on each wire. All units come with a set of four reusable pads or electrodes. These are placed on the skin surrounding the area involved. By inserting the leads into  the pads, the electricity can pass from the unit making the circuit complete.  As the intensity is turned up slowly, the electrical current enters the body from the electrodes through the skin to the surrounding nerve fibers. This triggers the release of hormones from within the body. These hormones contain pain relievers. By increasing the circulation of these hormones, the person's pain may be lessened. It is also believed that the electrical stimulation itself helps to block the pain messages being sent to the brain, thus also decreasing the body's perception of pain.   Hazards:  TENS units are NOT to be used by patients with PACEMAKERS, DEFIBRILLATORS, DIABETIC PUMPS, PREGNANT WOMEN, and patients with SEIZURE DISORDERS.  TENS units are NOT to be used over the heart, throat, brain, or spinal cord.  One of the major side effects from the TENS unit may be skin irritation. Some people may develop a rash if they are sensitive to the materials used in the electrodes or the connecting wires.   Wear the unit for 30 minutes at a time.     Avoid overuse due the body getting used to the stem making it not as effective over time.      

## 2017-03-10 NOTE — Therapy (Addendum)
[facility] [address]  [place], [place], [address] Phone: [phone]   Fax:  [phone]  Physical Therapy Treatment  Patient Details  Name: Melissa Mejia MRN: [id number] Date of Birth: 1966-07-05 Referring Provider: Dr. Karlton Lemon  Encounter Date: 03/10/2017      PT End of Session - 03/10/17 1705    Visit Number 9   Date for PT Re-Evaluation 03/11/17   PT Start Time 1705   PT Stop Time 1750   PT Time Calculation (min) 45 min   Activity Tolerance Patient tolerated treatment well   Behavior During Therapy [facility] for tasks assessed/performed      Past Medical History:  Diagnosis Date  . ADD (attention deficit disorder)   . History of esophageal reflux   . Right ACL tear   . Wears contact lenses     Past Surgical History:  Procedure Laterality Date  . KNEE ARTHROSCOPY WITH ANTERIOR CRUCIATE LIGAMENT (ACL) REPAIR WITH HAMSTRING GRAFT Left 02/09/2014   Procedure: LEFT KNEE ARTHROSCOPY WITH ALLOGRAFT ANTERIOR CRUCIATE LIGAMENT (ACL) HAMSTRING RECONSTRUCTION ;  Surgeon: Sydnee Cabal, MD;  Location: [facility];  Service: Orthopedics;  Laterality: Left;  . SHOULDER SURGERY Left 2008   labrum repair  . WISDOM TOOTH EXTRACTION      There were no vitals filed for this visit.      Subjective Assessment - 03/10/17 1707    Subjective Pt returning after nearly 4 week absence due family health issues (husband hospitalized). States her pain has been well controlled despite having to sleep in hospital widow seats and recliners.   Patient Stated Goals reduction in tension in left trap, increase mobiliy   Currently in Pain? No/denies   Pain Score 0-No pain            [facility] PT Assessment - 03/10/17 1705      Observation/Other Assessments   Focus on Therapeutic Outcomes (FOTO)  Shoulder - 29% limitation     ROM / Strength   AROM / PROM / Strength Strength     AROM   Overall AROM  Within  functional limits for tasks performed   AROM Assessment Site Cervical     Strength   Overall Strength Within functional limits for tasks performed   Strength Assessment Site Shoulder   Right/Left Shoulder Right;Left   Right Shoulder Flexion 5/5   Right Shoulder ABduction 5/5   Right Shoulder Internal Rotation 5/5   Right Shoulder External Rotation 5/5   Right Shoulder Horizontal ABduction 4+/5   Left Shoulder Flexion 5/5   Left Shoulder ABduction 5/5   Left Shoulder Internal Rotation 5/5   Left Shoulder External Rotation 5/5   Left Shoulder Horizontal ABduction --  4+/5                             PT Education - 03/10/17 1750    Education provided Yes   Education Details TENS/IT instruction   Person(s) Educated Patient   Methods Explanation;Demonstration   Comprehension Verbalized understanding;Returned demonstration          PT Short Term Goals - 02/12/17 0944      PT SHORT TERM GOAL #1   Title independent with initial HEP for flexibility by 02/11/17   Time 4   Period Weeks   Status Achieved     PT SHORT TERM GOAL #2   Title end of work day pain is  moderate due to reduction in muscle tightness and improved cervical ROM by 02/11/17   Time 4   Period Weeks   Status Achieved     PT SHORT TERM GOAL #3   Title ability to lift items with left arm with greater ease >/= 25% due to improve strength of left shoulder and decreased cervical muscle tension by 02/11/17   Time 4   Period Weeks   Status Achieved     PT SHORT TERM GOAL #4   Title ability to look behind her with >/= 25% greater ease due to full thoracic ROM by 02/11/17   Time 4   Period Weeks   Status Achieved           PT Long Term Goals - 03/10/17 1712      PT LONG TERM GOAL #1   Title independent with HEP by 03/11/17   Time 8   Period Weeks   Status Achieved     PT LONG TERM GOAL #2   Title reduction in tension >/= 75% in cervical so she is able to tolerate her work day    Time 8   Period Weeks   Status Achieved     PT LONG TERM GOAL #3   Title full ROM of cervical so she is able to perform her work duties with decreased >/= 75% in pain   Time 8   Period Weeks   Status Achieved     PT LONG TERM GOAL #4   Title understand ways to manage her pain, muscle tension and elongation of cervical muscles in case of relapse   Time 8   Period Weeks   Status Achieved     PT LONG TERM GOAL #5   Title FOTO score </= 28% limitation   Time 8   Period Weeks   Status Not Met  FOTO = 29% limitation - w/in 1% predicted target               Plan - 03/10/17 1740    Clinical Impression Statement Pt returning to PT after nearly 4 week absence from PT due to fmaily medical issues. Pt reports muscle tension has continued to improve and pain has remained well controlled despite less than ideal sleeping situtations and increased stress due to husband's illness. Pt feeling ready to try transitioning to HEP but requesting training in use of home TENS/IT unit approved by insurance. Instructions provided for set-up and use of TENS/IT unit as well as precautions and contraindications, with pt verbalizing good understanding. Pt now able to demonstrate full cervical and shoulder ROM w/o pain and B shoulder strength now 4+/5 to 5/5. Goal assessment completed with all goals met except FOTO within 1% of goal status. Will place pt on hold x 30 days inthe event that further issues arise as pt returns to normal work schedule.   Rehab Potential Excellent   Clinical Impairments Affecting Rehab Potential Left labrum repair 2008   PT Treatment/Interventions Cryotherapy;Electrical Stimulation;Iontophoresis 53m/ml Dexamethasone;Moist Heat;Ultrasound;Patient/family education;Neuromuscular re-education;Therapeutic exercise;Therapeutic activities;Manual techniques;Passive range of motion;Dry needling;Taping   PT Next Visit Plan 30 day hold   PT Home Exercise Plan --   Consulted and Agree with  Plan of Care Patient      Patient will benefit from skilled therapeutic intervention in order to improve the following deficits and impairments:  Decreased range of motion, Increased fascial restricitons, Increased muscle spasms, Decreased activity tolerance, Pain, Impaired flexibility, Decreased strength, Decreased mobility  Visit Diagnosis: Cervicalgia  Chronic left  shoulder pain  Muscle weakness (generalized)  Cramp and spasm     Problem List Patient Active Problem List   Diagnosis Date Noted  . Trapezius muscle strain 12/23/2016  . Left shoulder pain 12/23/2016  . ADHD 11/14/2016  . GERD (gastroesophageal reflux disease) 11/14/2016  . S/P ACL reconstruction 02/09/2014  . Left knee injury 01/03/2014    Percival Spanish, PT, MPT 03/10/2017, 6:11 PM  [facility] [address]  [place], [place], [address] Phone: [phone]   Fax:  [phone]  Name: Melissa Mejia MRN: [id number] Date of Birth: 1966-03-02   PHYSICAL THERAPY DISCHARGE SUMMARY  Visits from Start of Care: 9  Current functional level related to goals / functional outcomes:   Refer to above clinical impression. Pt has not needed to return to PT in >30 days, therefore will proceed with discharge from PT for this episode.   Remaining deficits:   As above.   Education / Equipment:   HEP  Plan: Patient agrees to discharge.  Patient goals were met. except FOTO goal. Patient is being discharged due to meeting the stated rehab goals.  ?????    Percival Spanish, PT, MPT 04/13/17, 11:33 AM  [facility] [address]  [place], [place], [address] Phone: [phone]   Fax:  [phone]

## 2017-03-13 ENCOUNTER — Ambulatory Visit: Payer: 59 | Admitting: Physical Therapy

## 2017-03-31 DIAGNOSIS — F900 Attention-deficit hyperactivity disorder, predominantly inattentive type: Secondary | ICD-10-CM | POA: Diagnosis not present

## 2017-04-01 MED FILL — ADDERALL XR 15 MG CAP SA: 15 | 30 days supply | Qty: 30 | Fill #0

## 2017-04-03 DIAGNOSIS — S46819A Strain of other muscles, fascia and tendons at shoulder and upper arm level, unspecified arm, initial encounter: Secondary | ICD-10-CM | POA: Diagnosis not present

## 2017-04-17 ENCOUNTER — Other Ambulatory Visit: Payer: Self-pay

## 2017-04-17 DIAGNOSIS — D492 Neoplasm of unspecified behavior of bone, soft tissue, and skin: Secondary | ICD-10-CM | POA: Diagnosis not present

## 2017-04-28 DIAGNOSIS — Z760 Encounter for issue of repeat prescription: Secondary | ICD-10-CM | POA: Diagnosis not present

## 2017-04-30 MED FILL — DEXTROAMP-AMPHETAMIN 20 MG: 20 | 30 days supply | Qty: 30 | Fill #0

## 2017-04-30 MED FILL — ADDERALL XR 15 MG CAP SA: 15 | 30 days supply | Qty: 30 | Fill #0

## 2017-05-04 DIAGNOSIS — S46819A Strain of other muscles, fascia and tendons at shoulder and upper arm level, unspecified arm, initial encounter: Secondary | ICD-10-CM | POA: Diagnosis not present

## 2017-06-03 DIAGNOSIS — S46819A Strain of other muscles, fascia and tendons at shoulder and upper arm level, unspecified arm, initial encounter: Secondary | ICD-10-CM | POA: Diagnosis not present

## 2017-07-01 MED FILL — ADDERALL XR 15 MG CAP SA: 15 | 30 days supply | Qty: 30 | Fill #0

## 2017-07-01 MED FILL — DEXTROAMP-AMPHETAMIN 20 MG: 20 | 30 days supply | Qty: 30 | Fill #0

## 2017-07-04 DIAGNOSIS — S46819A Strain of other muscles, fascia and tendons at shoulder and upper arm level, unspecified arm, initial encounter: Secondary | ICD-10-CM | POA: Diagnosis not present

## 2017-07-31 MED FILL — ADDERALL XR 15 MG CAP SA: 15 | 30 days supply | Qty: 30 | Fill #0

## 2017-07-31 MED FILL — DEXTROAMP-AMPHETAMIN 20 MG: 20 | 30 days supply | Qty: 30 | Fill #0

## 2017-08-03 DIAGNOSIS — S46819A Strain of other muscles, fascia and tendons at shoulder and upper arm level, unspecified arm, initial encounter: Secondary | ICD-10-CM | POA: Diagnosis not present

## 2017-09-03 DIAGNOSIS — S46819A Strain of other muscles, fascia and tendons at shoulder and upper arm level, unspecified arm, initial encounter: Secondary | ICD-10-CM | POA: Diagnosis not present

## 2017-09-18 MED FILL — ADDERALL XR 20 MG CAP SA: 20 | 30 days supply | Qty: 30 | Fill #0

## 2017-10-27 ENCOUNTER — Encounter: Payer: Self-pay | Admitting: Family

## 2017-10-30 MED FILL — DEXTROAMP-AMPHETAMIN 20 MG: 20 | 30 days supply | Qty: 30 | Fill #0

## 2017-12-08 ENCOUNTER — Encounter: Payer: Self-pay | Admitting: Family

## 2017-12-08 DIAGNOSIS — K439 Ventral hernia without obstruction or gangrene: Secondary | ICD-10-CM

## 2017-12-21 ENCOUNTER — Ambulatory Visit: Payer: Self-pay | Admitting: Surgery

## 2017-12-21 NOTE — H&P (Signed)
History of Present Illness Melissa Mejia. Melissa Whitsel MD; 12/21/2017 11:43 AM) The patient is a 52 year old female who presents with an umbilical hernia. Referred by Melissa Alar, NP for epigastric hernia  This is a 52 year old female in good health who presents with a 2-3 year history of a palpable mass in her epigastrium. She had an ultrasound in March 2018 that showed that this likely represented a small epigastric ventral hernia containing only fat. She has had 2 episodes where the mass became quite large and very tender. The skin overlying this area turned red. These seem to resolve spontaneously. She denies any obstructive symptoms. She has not had any further imaging.  The patient is an ED nurse at [employer]: Midline epigastric down swelling  EXAM: ABDOMEN ULTRASOUND COMPLETE  COMPARISON: None.  FINDINGS: Gallbladder: No gallstones or gallbladder wall thickening. No pericholecystic fluid. The sonographer reports no sonographic Murphy's sign.  Common bile duct: Diameter: 3 mm  Liver: No focal lesion identified. Within normal limits in parenchymal echogenicity.  IVC: No abnormality visualized.  Pancreas: Visualized portion unremarkable.  Spleen: Size and appearance within normal limits.  Right Kidney: Length: Bowel gas obscures lower pole. No hydronephrosis.  Left Kidney: Length: 10.1 cm. Echogenicity within normal limits. No mass or hydronephrosis visualized.  Abdominal aorta: No aneurysm visualized.  Other findings: Midline imaging was performed in the epigastric region of the anterior abdominal wall, as identified by the patient. This reveals a probable defect in the rectus sheath with small hernia sac visible.  IMPRESSION: Small midline epigastric hernia at the site of patient concern. Sonographer raised the question of associated bowel herniation, but static 2D grayscale and cine imaging suggests this may simply represent omental/preperitoneal  fat. CT imaging could be used for definitive characterization, as clinically warranted.   Electronically Signed By: Melissa Mejia [name] On: 11/14/2016 10:48   Past Surgical History Melissa Mejia, CMA; 12/21/2017 9:38 AM) Knee Surgery Left. Shoulder Surgery Left.  Diagnostic Studies History Melissa Mejia, CMA; 12/21/2017 9:38 AM) Colonoscopy never Mammogram >3 years ago Pap Smear 1-5 years ago  Allergies Melissa Mejia, CMA; 12/21/2017 9:39 AM) No Known Drug Allergies [12/21/2017]:  Medication History Melissa Mejia, CMA; 12/21/2017 9:39 AM) Amphetamine-Dextroamphetamine (20MG  Tablet, Oral) Active. Medications Reconciled  Social History Melissa Mejia, CMA; 12/21/2017 9:38 AM) Alcohol use Occasional alcohol use. Caffeine use Coffee. No drug use Tobacco use Former smoker.  Family History Melissa Mejia, [place]; 12/21/2017 9:38 AM) First Degree Relatives No pertinent family history  Pregnancy / Birth History Melissa Mejia, CMA; 12/21/2017 9:38 AM) Age at menarche 72 years. Gravida 3 Irregular periods Length (months) of breastfeeding 7-12 Para 3  Other Problems Melissa Mejia, CMA; 12/21/2017 9:38 AM) Ventral Hernia Repair     Review of Systems (Melissa Mejia CMA; 12/21/2017 9:38 AM) General Not Present- Appetite Loss, Chills, Fatigue, Fever, Night Sweats, Weight Gain and Weight Loss. Skin Not Present- Change in Wart/Mole, Dryness, Hives, Jaundice, New Lesions, Non-Healing Wounds, Rash and Ulcer. HEENT Not Present- Earache, Hearing Loss, Hoarseness, Nose Bleed, Oral Ulcers, Ringing in the Ears, Seasonal Allergies, Sinus Pain, Sore Throat, Visual Disturbances, Wears glasses/contact lenses and Yellow Eyes. Respiratory Not Present- Bloody sputum, Chronic Cough, Difficulty Breathing, Snoring and Wheezing. Breast Not Present- Breast Mass, Breast Pain, Nipple Discharge and Skin Changes. Cardiovascular Not Present- Chest Pain, Difficulty Breathing Lying Down, Leg Cramps,  Palpitations, Rapid Heart Rate, Shortness of Breath and Swelling of Extremities. Gastrointestinal Present- Abdominal Pain and Indigestion. Not Present- Bloating, Bloody Stool, Change in Bowel Habits,  Chronic diarrhea, Constipation, Difficulty Swallowing, Excessive gas, Gets full quickly at meals, Hemorrhoids, Nausea, Rectal Pain and Vomiting. Female Genitourinary Not Present- Frequency, Nocturia, Painful Urination, Pelvic Pain and Urgency. Musculoskeletal Not Present- Back Pain, Joint Pain, Joint Stiffness, Muscle Pain, Muscle Weakness and Swelling of Extremities. Neurological Not Present- Decreased Memory, Fainting, Headaches, Numbness, Seizures, Tingling, Tremor, Trouble walking and Weakness. Psychiatric Not Present- Anxiety, Bipolar, Change in Sleep Pattern, Depression, Fearful and Frequent crying. Endocrine Not Present- Cold Intolerance, Excessive Hunger, Hair Changes, Heat Intolerance, Hot flashes and New Diabetes. Hematology Not Present- Blood Thinners, Easy Bruising, Excessive bleeding, Gland problems, HIV and Persistent Infections.  Vitals (Melissa Mejia CMA; 12/21/2017 9:39 AM) 12/21/2017 9:38 AM Weight: 189.2 lb Height: 64.5in Body Surface Area: 1.92 m Body Mass Index: 31.97 kg/m  Temp.: 97.4F(Oral)  Pulse: 83 (Regular)  BP: 130/90 (Sitting, Left Arm, Standard)      Physical Exam Melissa Key K. Maddison Kilner MD; 12/21/2017 11:44 AM)  The physical exam findings are as follows: Note:WDWN in NAD Eyes: Pupils equal, round; sclera anicteric HENT: Oral mucosa moist; good dentition Neck: No masses palpated, no thyromegaly Lungs: CTA bilaterally; normal respiratory effort CV: Regular rate and rhythm; no murmurs; extremities well-perfused with no edema Abd: +bowel sounds, soft, non-tender, no palpable organomegaly; palpable hernia about 4 cm above umbilicus just to the left of midline Skin: Warm, dry; no sign of jaundice Psychiatric - alert and oriented x 4; calm mood and  affect    Assessment & Plan Melissa Key K. Chala Gul MD; 12/21/2017 11:40 AM)  EPIGASTRIC HERNIA (K43.9)  Current Plans Schedule for Surgery - Open repair of epigastric hernia with mesh. The surgical procedure has been discussed with the patient. Potential risks, benefits, alternative treatments, and expected outcomes have been explained. All of the patient's questions at this time have been answered. The likelihood of reaching the patient's treatment goal is good. The patient understand the proposed surgical procedure and wishes to proceed.  Melissa Mejia. Melissa Dover, MD, [facility]  General/ Trauma Surgery  12/21/2017 11:45 AM

## 2017-12-21 NOTE — H&P (View-Only) (Signed)
History of Present Illness Melissa Mejia. Melissa Neubert MD; 12/21/2017 11:43 AM) The patient is a 52 year old female who presents with an umbilical hernia. Referred by Debbrah Alar, NP for epigastric hernia  This is a 52 year old female in good health who presents with a 2-3 year history of a palpable mass in her epigastrium. She had an ultrasound in March 2018 that showed that this likely represented a small epigastric ventral hernia containing only fat. She has had 2 episodes where the mass became quite large and very tender. The skin overlying this area turned red. These seem to resolve spontaneously. She denies any obstructive symptoms. She has not had any further imaging.  The patient is an ED nurse at [employer]: Midline epigastric down swelling  EXAM: ABDOMEN ULTRASOUND COMPLETE  COMPARISON: None.  FINDINGS: Gallbladder: No gallstones or gallbladder wall thickening. No pericholecystic fluid. The sonographer reports no sonographic Murphy's sign.  Common bile duct: Diameter: 3 mm  Liver: No focal lesion identified. Within normal limits in parenchymal echogenicity.  IVC: No abnormality visualized.  Pancreas: Visualized portion unremarkable.  Spleen: Size and appearance within normal limits.  Right Kidney: Length: Bowel gas obscures lower pole. No hydronephrosis.  Left Kidney: Length: 10.1 cm. Echogenicity within normal limits. No mass or hydronephrosis visualized.  Abdominal aorta: No aneurysm visualized.  Other findings: Midline imaging was performed in the epigastric region of the anterior abdominal wall, as identified by the patient. This reveals a probable defect in the rectus sheath with small hernia sac visible.  IMPRESSION: Small midline epigastric hernia at the site of patient concern. Sonographer raised the question of associated bowel herniation, but static 2D grayscale and cine imaging suggests this may simply represent omental/preperitoneal  fat. CT imaging could be used for definitive characterization, as clinically warranted.   Electronically Signed By: Misty Stanley [name] On: 11/14/2016 10:48   Past Surgical History Malachi Bonds, CMA; 12/21/2017 9:38 AM) Knee Surgery Left. Shoulder Surgery Left.  Diagnostic Studies History Malachi Bonds, CMA; 12/21/2017 9:38 AM) Colonoscopy never Mammogram >3 years ago Pap Smear 1-5 years ago  Allergies Malachi Bonds, CMA; 12/21/2017 9:39 AM) No Known Drug Allergies [12/21/2017]:  Medication History Malachi Bonds, CMA; 12/21/2017 9:39 AM) Amphetamine-Dextroamphetamine (20MG  Tablet, Oral) Active. Medications Reconciled  Social History Malachi Bonds, CMA; 12/21/2017 9:38 AM) Alcohol use Occasional alcohol use. Caffeine use Coffee. No drug use Tobacco use Former smoker.  Family History Malachi Bonds, [place]; 12/21/2017 9:38 AM) First Degree Relatives No pertinent family history  Pregnancy / Birth History Malachi Bonds, CMA; 12/21/2017 9:38 AM) Age at menarche 39 years. Gravida 3 Irregular periods Length (months) of breastfeeding 7-12 Para 3  Other Problems Malachi Bonds, CMA; 12/21/2017 9:38 AM) Ventral Hernia Repair     Review of Systems (Chemira Jones CMA; 12/21/2017 9:38 AM) General Not Present- Appetite Loss, Chills, Fatigue, Fever, Night Sweats, Weight Gain and Weight Loss. Skin Not Present- Change in Wart/Mole, Dryness, Hives, Jaundice, New Lesions, Non-Healing Wounds, Rash and Ulcer. HEENT Not Present- Earache, Hearing Loss, Hoarseness, Nose Bleed, Oral Ulcers, Ringing in the Ears, Seasonal Allergies, Sinus Pain, Sore Throat, Visual Disturbances, Wears glasses/contact lenses and Yellow Eyes. Respiratory Not Present- Bloody sputum, Chronic Cough, Difficulty Breathing, Snoring and Wheezing. Breast Not Present- Breast Mass, Breast Pain, Nipple Discharge and Skin Changes. Cardiovascular Not Present- Chest Pain, Difficulty Breathing Lying Down, Leg Cramps,  Palpitations, Rapid Heart Rate, Shortness of Breath and Swelling of Extremities. Gastrointestinal Present- Abdominal Pain and Indigestion. Not Present- Bloating, Bloody Stool, Change in Bowel Habits,  Chronic diarrhea, Constipation, Difficulty Swallowing, Excessive gas, Gets full quickly at meals, Hemorrhoids, Nausea, Rectal Pain and Vomiting. Female Genitourinary Not Present- Frequency, Nocturia, Painful Urination, Pelvic Pain and Urgency. Musculoskeletal Not Present- Back Pain, Joint Pain, Joint Stiffness, Muscle Pain, Muscle Weakness and Swelling of Extremities. Neurological Not Present- Decreased Memory, Fainting, Headaches, Numbness, Seizures, Tingling, Tremor, Trouble walking and Weakness. Psychiatric Not Present- Anxiety, Bipolar, Change in Sleep Pattern, Depression, Fearful and Frequent crying. Endocrine Not Present- Cold Intolerance, Excessive Hunger, Hair Changes, Heat Intolerance, Hot flashes and New Diabetes. Hematology Not Present- Blood Thinners, Easy Bruising, Excessive bleeding, Gland problems, HIV and Persistent Infections.  Vitals (Chemira Jones CMA; 12/21/2017 9:39 AM) 12/21/2017 9:38 AM Weight: 189.2 lb Height: 64.5in Body Surface Area: 1.92 m Body Mass Index: 31.97 kg/m  Temp.: 97.24F(Oral)  Pulse: 83 (Regular)  BP: 130/90 (Sitting, Left Arm, Standard)      Physical Exam Rodman Key K. Cartez Mogle MD; 12/21/2017 11:44 AM)  The physical exam findings are as follows: Note:WDWN in NAD Eyes: Pupils equal, round; sclera anicteric HENT: Oral mucosa moist; good dentition Neck: No masses palpated, no thyromegaly Lungs: CTA bilaterally; normal respiratory effort CV: Regular rate and rhythm; no murmurs; extremities well-perfused with no edema Abd: +bowel sounds, soft, non-tender, no palpable organomegaly; palpable hernia about 4 cm above umbilicus just to the left of midline Skin: Warm, dry; no sign of jaundice Psychiatric - alert and oriented x 4; calm mood and  affect    Assessment & Plan Rodman Key K. Mariana Wiederholt MD; 12/21/2017 11:40 AM)  EPIGASTRIC HERNIA (K43.9)  Current Plans Schedule for Surgery - Open repair of epigastric hernia with mesh. The surgical procedure has been discussed with the patient. Potential risks, benefits, alternative treatments, and expected outcomes have been explained. All of the patient's questions at this time have been answered. The likelihood of reaching the patient's treatment goal is good. The patient understand the proposed surgical procedure and wishes to proceed.  Melissa Mejia. Georgette Dover, MD, [facility] Surgery  General/ Trauma Surgery  12/21/2017 11:45 AM

## 2017-12-28 ENCOUNTER — Ambulatory Visit (INDEPENDENT_AMBULATORY_CARE_PROVIDER_SITE_OTHER): Payer: No Typology Code available for payment source | Admitting: Family

## 2017-12-28 ENCOUNTER — Telehealth: Payer: Self-pay | Admitting: Family

## 2017-12-28 ENCOUNTER — Encounter: Payer: Self-pay | Admitting: Family

## 2017-12-28 VITALS — BP 123/87 | HR 74 | Temp 98.4°F | Resp 16 | Ht 64.75 in | Wt 188.4 lb

## 2017-12-28 DIAGNOSIS — Z0000 Encounter for general adult medical examination without abnormal findings: Secondary | ICD-10-CM

## 2017-12-28 DIAGNOSIS — M542 Cervicalgia: Secondary | ICD-10-CM | POA: Diagnosis not present

## 2017-12-28 DIAGNOSIS — M722 Plantar fascial fibromatosis: Secondary | ICD-10-CM

## 2017-12-28 LAB — CBC WITH DIFFERENTIAL/PLATELET
BASOS ABS: 0 10*3/uL (ref 0.0–0.1)
Basophils Relative: 0.8 % (ref 0.0–3.0)
Eosinophils Absolute: 0.2 10*3/uL (ref 0.0–0.7)
Eosinophils Relative: 3.2 % (ref 0.0–5.0)
HCT: 38.3 % (ref 36.0–46.0)
HEMOGLOBIN: 13.3 g/dL (ref 12.0–15.0)
Lymphocytes Relative: 22.1 % (ref 12.0–46.0)
Lymphs Abs: 1.2 10*3/uL (ref 0.7–4.0)
MCHC: 34.7 g/dL (ref 30.0–36.0)
MCV: 89.3 fl (ref 78.0–100.0)
MONOS PCT: 8.5 % (ref 3.0–12.0)
Monocytes Absolute: 0.4 10*3/uL (ref 0.1–1.0)
Neutro Abs: 3.4 10*3/uL (ref 1.4–7.7)
Neutrophils Relative %: 65.4 % (ref 43.0–77.0)
Platelets: 234 10*3/uL (ref 150.0–400.0)
RBC: 4.29 Mil/uL (ref 3.87–5.11)
RDW: 12.1 % (ref 11.5–15.5)
WBC: 5.3 10*3/uL (ref 4.0–10.5)

## 2017-12-28 LAB — URINALYSIS, ROUTINE W REFLEX MICROSCOPIC
BILIRUBIN URINE: NEGATIVE
Hgb urine dipstick: NEGATIVE
Ketones, ur: NEGATIVE
Leukocytes, UA: NEGATIVE
Nitrite: NEGATIVE
PH: 6.5 (ref 5.0–8.0)
RBC / HPF: NONE SEEN (ref 0–?)
SPECIFIC GRAVITY, URINE: 1.02 (ref 1.000–1.030)
TOTAL PROTEIN, URINE-UPE24: NEGATIVE
Urine Glucose: NEGATIVE
Urobilinogen, UA: 0.2 (ref 0.0–1.0)

## 2017-12-28 LAB — LIPID PANEL
Cholesterol: 156 mg/dL (ref 0–200)
HDL: 67.7 mg/dL (ref >39.00)
LDL Cholesterol: 80 mg/dL (ref 0–99)
NONHDL: 88.75
Total CHOL/HDL Ratio: 2
Triglycerides: 43 mg/dL (ref 0.0–149.0)
VLDL: 8.6 mg/dL (ref 0.0–40.0)

## 2017-12-28 LAB — BASIC METABOLIC PANEL
BUN: 10 mg/dL (ref 6–23)
CALCIUM: 8.8 mg/dL (ref 8.4–10.5)
CO2: 29 mEq/L (ref 19–32)
Chloride: 104 mEq/L (ref 96–112)
Creatinine, Ser: 0.86 mg/dL (ref 0.40–1.20)
GFR: 74.01 mL/min (ref >60.00)
Glucose, Bld: 97 mg/dL (ref 70–99)
POTASSIUM: 4.2 meq/L (ref 3.5–5.1)
SODIUM: 139 meq/L (ref 135–145)

## 2017-12-28 LAB — HEPATIC FUNCTION PANEL
ALBUMIN: 3.7 g/dL (ref 3.5–5.2)
ALK PHOS: 55 U/L (ref 39–117)
ALT: 17 U/L (ref 0–35)
AST: 18 U/L (ref 0–37)
BILIRUBIN TOTAL: 0.6 mg/dL (ref 0.2–1.2)
Bilirubin, Direct: 0.1 mg/dL (ref 0.0–0.3)
Total Protein: 6.4 g/dL (ref 6.0–8.3)

## 2017-12-28 LAB — TSH: TSH: 1.76 u[IU]/mL (ref 0.35–4.50)

## 2017-12-28 NOTE — Patient Instructions (Signed)
Continue to work on [employer], regular exercise and weight loss. Complete lab work prior to leaving. Please check cologuard coverage with your insurance and let us know if you wish to proceed.

## 2017-12-28 NOTE — Progress Notes (Signed)
Subjective:    Patient ID: Melissa Mejia, female    DOB: Dec 28, 1965, 52 y.o.   MRN: [id number]  HPI  Patient presents today for complete physical.  Immunizations: tdap 2015 Diet: reports that her diet is erratic.  Feels that hormones are contributing Wt Readings from Last 3 Encounters:  12/28/17 188 lb 6.4 oz (85.5 kg)  12/26/16 173 lb 6.4 oz (78.7 kg)  11/12/16 173 lb 12.8 oz (78.8 kg)  Exercise:not exercising regularly, planing hernia repair soon Colonoscopy: requesting cologuard in place of colonoscopy.  Pap Smear:  12/26/16 negative Mammogram: due Vision:  Last year Dental: 6 months ago  She reports some neck/trapezious pain/tightness which had previously improved with pt. Requesting referral back.  Also reports bilateral foot pain, "like I am stepping on a rock sometimes."       Assessment & Plan:      Review of Systems  Constitutional: Positive for unexpected weight change.  HENT: Negative for rhinorrhea.   Respiratory: Negative for cough.   Cardiovascular:       Mild LE edema after long shifts  Genitourinary: Negative for dysuria, frequency and hematuria.       Reports periods are "mostly regular"  Musculoskeletal:       Occasional ankle pain in the AM's  Neurological:       Occasional headaches, if she drinks more than 2 drinks she has a HA  Hematological: Negative for adenopathy.  Psychiatric/Behavioral:       Denies depression/anxiety   Past Medical History:  Diagnosis Date  . ADD (attention deficit disorder)   . History of esophageal reflux   . Right ACL tear   . Wears contact lenses      Social History   Socioeconomic History  . Marital status: Married    Spouse name: Not on file  . Number of children: Not on file  . Years of education: Not on file  . Highest education level: Not on file  Occupational History  . Not on file  Social Needs  . Financial resource strain: Not on file  . Food insecurity:    Worry: Not on file    Inability: Not  on file  . Transportation needs:    Medical: Not on file    Non-medical: Not on file  Tobacco Use  . Smoking status: Former Smoker    Years: 4.00    Types: Cigarettes    Last attempt to quit: 02/09/1993    Years since quitting: 24.8  . Smokeless tobacco: Never Used  . Tobacco comment: hx  social some day smoker  Substance and Sexual Activity  . Alcohol use: Yes    Alcohol/week: 1.8 oz    Types: 1 Glasses of wine, 2 Cans of beer per week    Comment: occasional   . Drug use: No  . Sexual activity: Not on file  Lifestyle  . Physical activity:    Days per week: Not on file    Minutes per session: Not on file  . Stress: Not on file  Relationships  . Social connections:    Talks on phone: Not on file    Gets together: Not on file    Attends religious service: Not on file    Active member of club or organization: Not on file    Attends meetings of clubs or organizations: Not on file    Relationship status: Not on file  . Intimate partner violence:    Fear of current or ex partner:  Not on file    Emotionally abused: Not on file    Physically abused: Not on file    Forced sexual activity: Not on file  Other Topics Concern  . Not on file  Social History Narrative   RN at [employer] [facility]   Has 3 children 56, 45 and 16   Enjoys sleeping, spending time with family, spending time outside.    Enjoys cooking, skiing    Past Surgical History:  Procedure Laterality Date  . KNEE ARTHROSCOPY WITH ANTERIOR CRUCIATE LIGAMENT (ACL) REPAIR WITH HAMSTRING GRAFT Left 02/09/2014   Procedure: LEFT KNEE ARTHROSCOPY WITH ALLOGRAFT ANTERIOR CRUCIATE LIGAMENT (ACL) HAMSTRING RECONSTRUCTION ;  Surgeon: Sydnee Cabal, MD;  Location: [place];  Service: Orthopedics;  Laterality: Left;  . SHOULDER SURGERY Left 2008   labrum repair  . WISDOM TOOTH EXTRACTION      Family History  Problem Relation Age of Onset  . Cancer Father        throat cancer    No Known Allergies  Current  Outpatient Medications on File Prior to Visit  Medication Sig Dispense Refill  . amphetamine-dextroamphetamine (ADDERALL XR) 15 MG 24 hr capsule Take 15 mg by mouth every morning.    Marland Kitchen amphetamine-dextroamphetamine (ADDERALL) 20 MG tablet Take 20 mg by mouth daily as needed.     No current facility-administered medications on file prior to visit.     BP 123/87 (BP Location: Right Arm, Cuff Size: Large)   Pulse 74   Temp 98.4 F (36.9 C) (Oral)   Resp 16   Ht 5' 4.75" (1.645 m)   Wt 188 lb 6.4 oz (85.5 kg)   LMP 12/12/2017   SpO2 100%   BMI 31.59 kg/m        Objective:   Physical Exam  Physical Exam  Constitutional: She is oriented to person, place, and time. She appears well-developed and well-nourished. No distress.  HENT:  Head: Normocephalic and atraumatic.  Right Ear: Tympanic membrane and ear canal normal.  Left Ear: Tympanic membrane and ear canal normal.  Mouth/Throat: Oropharynx is clear and moist.  Eyes: Pupils are equal, round, and reactive to light. No scleral icterus.  Neck: Normal range of motion. No thyromegaly present.  Cardiovascular: Normal rate and regular rhythm.   No murmur heard. Pulmonary/Chest: Effort normal and breath sounds normal. No respiratory distress. He has no wheezes. She has no rales. She exhibits no tenderness.  Abdominal: Soft. Bowel sounds are normal. She exhibits no distension, small ventral hernia is noted. There is no tenderness. There is no rebound and no guarding.  Musculoskeletal: She exhibits no edema.  Lymphadenopathy:    She has no cervical adenopathy.  Neurological: She is alert and oriented to person, place, and time. She has normal patellar reflexes. She exhibits normal muscle tone. Coordination normal.  Skin: Skin is warm and dry.  Psychiatric: She has a normal mood and affect. Her behavior is normal. Judgment and thought content normal.  Breasts: Examined lying Right: Without masses, retractions, discharge or axillary  adenopathy.  Left: Without masses, retractions, discharge or axillary adenopathy.  Pelvic: deferred       Assessment & Plan:   Preventative Care- discussed healthy diet, exercise and weight loss.  Obtain routine lab work. Pap up to date, tetanus up to date. Refer for mammo. She prefers cologuard rather than colonoscopy and will confirm coverage with her insurance and let us know. EKG tracing is personally reviewed.  EKG notes NSR.  No acute  changes. Again note is made of short pr but PR Is better than the last time it was checked-asymptomatic- monitor.   Neck pain/plantar fasciittis- advised pt on short course of otc aleve, icing feet by rolling on a water bottle at night. Refer back to PT for therapy at pt request.  Assessment & Plan:

## 2017-12-28 NOTE — Telephone Encounter (Signed)
Could you please order cologuard for her?

## 2017-12-28 NOTE — Telephone Encounter (Signed)
Mychart message sent to pt to check insurance coverage and let us know, then we can proceed with cologuard.

## 2018-01-02 ENCOUNTER — Ambulatory Visit (HOSPITAL_BASED_OUTPATIENT_CLINIC_OR_DEPARTMENT_OTHER)
Admission: RE | Admit: 2018-01-02 | Discharge: 2018-01-02 | Disposition: A | Discharge status: Home / Self Care | Payer: No Typology Code available for payment source | Source: Ambulatory Visit | Attending: Family | Admitting: Family

## 2018-01-02 DIAGNOSIS — Z0000 Encounter for general adult medical examination without abnormal findings: Secondary | ICD-10-CM | POA: Insufficient documentation

## 2018-01-05 ENCOUNTER — Encounter (HOSPITAL_BASED_OUTPATIENT_CLINIC_OR_DEPARTMENT_OTHER): Payer: Self-pay | Admitting: *Deleted

## 2018-01-05 ENCOUNTER — Other Ambulatory Visit: Payer: Self-pay

## 2018-01-05 ENCOUNTER — Ambulatory Visit: Payer: No Typology Code available for payment source | Attending: Family | Admitting: Physical Therapy

## 2018-01-05 DIAGNOSIS — M79672 Pain in left foot: Secondary | ICD-10-CM | POA: Diagnosis present

## 2018-01-05 DIAGNOSIS — R262 Difficulty in walking, not elsewhere classified: Secondary | ICD-10-CM | POA: Diagnosis present

## 2018-01-05 DIAGNOSIS — R252 Cramp and spasm: Secondary | ICD-10-CM | POA: Diagnosis present

## 2018-01-05 DIAGNOSIS — M6281 Muscle weakness (generalized): Secondary | ICD-10-CM | POA: Diagnosis present

## 2018-01-05 DIAGNOSIS — M542 Cervicalgia: Secondary | ICD-10-CM

## 2018-01-05 DIAGNOSIS — M79671 Pain in right foot: Secondary | ICD-10-CM | POA: Diagnosis present

## 2018-01-05 NOTE — Therapy (Signed)
[facility] [address]  [place], [place], [address] Phone: [phone]   Fax:  [phone]  Physical Therapy Evaluation  Patient Details  Name: NAYLEA WIGINGTON MRN: [id number] Date of Birth: 1966/05/29 Referring Provider: Debbrah Alar, NP   Encounter Date: 01/05/2018  PT End of Session - 01/05/18 1400    Visit Number  1    Number of Visits  12    Date for PT Re-Evaluation  03/02/18    Authorization Type  [facility]    PT Start Time  1400    PT Stop Time  1508    PT Time Calculation (min)  68 min    Activity Tolerance  Patient tolerated treatment well    Behavior During Therapy  [facility] for tasks assessed/performed       Past Medical History:  Diagnosis Date  . ADD (attention deficit disorder)   . History of esophageal reflux   . Right ACL tear   . Wears contact lenses     Past Surgical History:  Procedure Laterality Date  . KNEE ARTHROSCOPY WITH ANTERIOR CRUCIATE LIGAMENT (ACL) REPAIR WITH HAMSTRING GRAFT Left 02/09/2014   Procedure: LEFT KNEE ARTHROSCOPY WITH ALLOGRAFT ANTERIOR CRUCIATE LIGAMENT (ACL) HAMSTRING RECONSTRUCTION ;  Surgeon: Sydnee Cabal, MD;  Location: [place];  Service: Orthopedics;  Laterality: Left;  . SHOULDER ARTHROSCOPY W/ LABRAL REPAIR Left 06/08/2007  . SHOULDER SURGERY Left 2008   labrum repair  . WISDOM TOOTH EXTRACTION      There were no vitals filed for this visit.   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 Plantar fascia pain has been ongoing for >1 yr. Pushes through pain typically but does note fatigue and increased pain after working 12hr shifts.    Currently in Pain?  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, sports or meal prep at restaurant    Pain Relieving Factors  massage    Effect of Pain on Daily Activities  pushes through pain in most circumstances    Multiple Pain Sites  Yes    Pain Score  3    Pain Location  Foot arch of foot & heelcord    Pain Orientation  Right;Left    Pain Descriptors / Indicators  Sore;Tender;Aching    Pain Type  Chronic pain    Pain Onset  More than a month ago >1 yr    Pain Frequency  Intermittent    Aggravating Factors   worst upon first putting pressure on feet after period of inactivity; fatigue with dull ache after prolonged standing/walking    Pain Relieving Factors  massage, stretch, self-STM on golf ball    Effect of Pain on Daily Activities  pushes through pain in most circumstances  [facility] PT Assessment - 01/05/18 1400      Assessment   Medical Diagnosis  Neck pain & B plantar fascia pain    Referring Provider  Debbrah Alar, NP    Onset Date/Surgical Date  -- 3-4 mos for neck; >1 yr on feet    Hand Dominance  Right    Next MD Visit  none scheduled    Prior Therapy  PT for neck last year; post-op after shoudler & knee surgeries      Balance Screen   Has the patient fallen in the past 6 months  No    Has the patient had a decrease in activity level because of a fear of falling?   No    Is the patient reluctant to leave their home because of a fear of falling?   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    Leisure  "mom stuff", would like to read more      Observation/Other Assessments   Focus on Therapeutic Outcomes (FOTO)   Neck - 64% (36% limitation); predicted 70% (30% limitation)      Posture/Postural Control   Posture/Postural Control  Postural limitations    Postural Limitations  Forward head;Rounded Shoulders      ROM / Strength   AROM / PROM / Strength  AROM;Strength      AROM   AROM Assessment Site  Cervical;Ankle    Right/Left Ankle  Right;Left    Right Ankle Dorsiflexion  8    Right Ankle Plantar Flexion  55    Right Ankle Inversion  35    Right Ankle Eversion  18    Left Ankle Dorsiflexion  7    Left Ankle Plantar Flexion  60    Left Ankle Inversion  33    Left Ankle Eversion  22    Cervical Flexion  41    Cervical Extension  65    Cervical - Right Side Bend  26    Cervical - Left Side Bend  24    Cervical - Right Rotation  70    Cervical - Left Rotation  65      Strength   Strength Assessment Site  Ankle    Right/Left Ankle  Right;Left    Right Ankle Dorsiflexion  4/5    Right Ankle Plantar Flexion  3+/5    Right Ankle Inversion  4+/5    Right Ankle Eversion  4/5    Left Ankle Dorsiflexion  4+/5    Left Ankle Plantar Flexion  3+/5    Left Ankle Inversion  4+/5    Left Ankle Eversion  4/5      Palpation   Palpation comment  ttp over B UT, LS, rhomboids & periscapular muscles in upper body; ttp in B feet over length on B plantar fascia (most tender mid muscle belly) and distal heel cord                Objective measurements  completed on examination: See above findings.      [facility] Adult PT Treatment/Exercise - 01/05/18 1400      Self-Care   Self-Care  Heat/Ice Application;Other Self-Care Comments    Heat/Ice Application  Instrcuted pt in ice water bottle rolling to plantar fascia.    Other  Self-Care Comments   Instruced pt in cross-friction massage to plantar fascia, as well as self-STM/MFR with small ball.      Ankle Exercises: Stretches   Plantar Fascia Stretch  30 seconds;2 reps    Plantar Fascia Stretch Limitations  manual self-stretch & toes curled on wall    Soleus Stretch  30 seconds;1 rep    Soleus Stretch Limitations  standing at wall    Gastroc Stretch  30 seconds;1 rep    Gastroc Stretch Limitations  standing at wall             PT Education - 01/05/18 1505    Education provided  Yes    Education Details  PT eval findings, anticipated POC & initial HEP    Person(s) Educated  Patient    Methods  Explanation;Demonstration;Handout    Comprehension  Verbalized understanding;Returned demonstration       PT Short Term Goals - 01/05/18 1508      PT SHORT TERM GOAL #1   Title  Independent with initial HEP    Status  New    Target Date  01/26/18      PT SHORT TERM GOAL #2   Title  B foot and ankle DF & eversion AROM WFL to allow for normal gait mechanics    Status  New    Target Date  01/26/18      PT SHORT TERM GOAL #3   Title  Pt will verbalize/demonstrate understanding of neutral cervical spine and shoulder posture to reduce cervical strain with daily chores and work tasks    Status  New    Target Date  01/26/18        PT Long Term Goals - 01/05/18 1508      PT LONG TERM GOAL #1   Title  Independent with ongoing HEP     Status  New    Target Date  03/02/18      PT LONG TERM GOAL #2   Title  Pt will report reduction in cervical muscle tension by >/= 75%to allow improved tolerance for her work day    Status  New      PT LONG TERM GOAL #3   Title  Pt will report >/= 50% decrease in pain in B plantar fascia with rising in the morning and on completion of work shift      Status  New    Target Date  03/02/18      PT LONG TERM GOAL #4   Title  B foot and ankle strength >/= 4+/5 for improved stabililty and work tolerance    Status  New     Target Date  03/02/18      PT LONG TERM GOAL #5   Title  Pt will verbalize understanding of ways to manage her pain, muscle tension and elongation of cervical muscles in case of relapse    Status  New    Target Date  03/02/18             Plan - 01/05/18 1508    Clinical Impression Statement  Desirai is a 52 y/o female presenting to OP PT for an acute on chronic exacerbation of  neck pain over then past 3-4 months as well as B plantar fasciitis originating over 1 year ago. 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 Posture demonstrates B plantar fascia pain noted to be worst upon rising in the morning or upon trying to walk after prolonged periods of inactivity, with some fatigue noted after longs days on her feet. Cervical ROM WFL/WNL with only mild restriction in B side bending. B foot and ankle ROM demonstrating slight restriction in DF and eversion ROM. Foot and ankle weakness most pronounced in PF with pt also noting lack of balance/stability with single limb activities. Pt reports she typically pushes through the pain but does feel limited with tolerance for work shifts both as a Therapist, sports as well as working in [employer] by both neck and foot pain.Pt will benefit from skilled PT to address deficits listed to decrease pain interference with daily tasks.    History and Personal Factors relevant to plan of care:  Left  Shoulder labrum repair 2008; h/o PT for cervicothoracic pain ~1 yr ago    Clinical Presentation  Stable    Clinical Decision Making  Low    Rehab Potential  Good    Clinical Impairments Affecting Rehab Potential  pending surgery for hernia repair on 01/13/18    PT Frequency  2x / week    PT Duration  6 weeks    PT Treatment/Interventions  Patient/family education;ADLs/Self Care Home Management;Therapeutic exercise;Therapeutic activities;Balance training;Manual  techniques;Dry needling;Taping;Electrical Stimulation;Moist Heat;Traction;Ultrasound;Iontophoresis 4mg /ml Dexamethasone;Cryotherapy;Vasopneumatic Device    Consulted and Agree with Plan of Care  Patient       Patient will benefit from skilled therapeutic intervention in order to improve the following deficits and impairments:  Pain, Increased muscle spasms, Postural dysfunction, Impaired flexibility, Decreased strength, Difficulty walking, Decreased activity tolerance  Visit Diagnosis: Cervicalgia  Pain in left foot  Pain in right foot  Cramp and spasm  Muscle weakness (generalized)  Difficulty in walking, not elsewhere classified     Problem List Patient Active Problem List   Diagnosis Date Noted  . Trapezius muscle strain 12/23/2016  . Left shoulder pain 12/23/2016  . ADHD 11/14/2016  . GERD (gastroesophageal reflux disease) 11/14/2016  . S/P ACL reconstruction 02/09/2014  . Left knee injury 01/03/2014    Percival Spanish, PT, MPT 01/05/2018, 7:02 PM  [facility] [address]  [place], [place], [address] Phone: [phone]   Fax:  [phone]  Name: LOTUS SANTILLO MRN: [id number] Date of Birth: 07-02-1966

## 2018-01-06 ENCOUNTER — Other Ambulatory Visit: Payer: Self-pay

## 2018-01-06 ENCOUNTER — Encounter (HOSPITAL_BASED_OUTPATIENT_CLINIC_OR_DEPARTMENT_OTHER): Payer: Self-pay | Admitting: *Deleted

## 2018-01-06 NOTE — Pre-Procedure Instructions (Signed)
Bring all medications. Pt coming tomorrow to pick up Ensure drink.Marland Kitchen

## 2018-01-07 NOTE — Progress Notes (Signed)
Pt picked up ensure drink. Instructions reviewed and pt. Verbalized understanding. Pt also given Hiblicins with instructions. Pt states her skin is highly sensitive and will try in a "test area" prior to usage.  Instructed to use Dial antibacterial if hiblicins will not work for her skin and to alert Pre-op RN DOS

## 2018-01-12 MED FILL — ADDERALL XR 20 MG CAP SA: 20 | 30 days supply | Qty: 30 | Fill #0

## 2018-01-12 MED FILL — DEXTROAMP-AMPHETAMIN 20 MG: 20 | 30 days supply | Qty: 30 | Fill #0

## 2018-01-13 ENCOUNTER — Other Ambulatory Visit: Payer: Self-pay

## 2018-01-13 ENCOUNTER — Ambulatory Visit (HOSPITAL_BASED_OUTPATIENT_CLINIC_OR_DEPARTMENT_OTHER): Payer: No Typology Code available for payment source | Admitting: Certified Registered"

## 2018-01-13 ENCOUNTER — Encounter (HOSPITAL_BASED_OUTPATIENT_CLINIC_OR_DEPARTMENT_OTHER)
Admission: RE | Disposition: A | Discharge status: Home / Self Care | Payer: Self-pay | Source: Ambulatory Visit | Attending: Surgery

## 2018-01-13 ENCOUNTER — Ambulatory Visit (HOSPITAL_BASED_OUTPATIENT_CLINIC_OR_DEPARTMENT_OTHER)
Admission: RE | Admit: 2018-01-13 | Discharge: 2018-01-13 | Disposition: A | Discharge status: Home / Self Care | Payer: No Typology Code available for payment source | Source: Ambulatory Visit | Attending: Surgery | Admitting: Surgery

## 2018-01-13 ENCOUNTER — Encounter (HOSPITAL_BASED_OUTPATIENT_CLINIC_OR_DEPARTMENT_OTHER): Payer: Self-pay | Admitting: *Deleted

## 2018-01-13 DIAGNOSIS — Z87891 Personal history of nicotine dependence: Secondary | ICD-10-CM | POA: Diagnosis not present

## 2018-01-13 DIAGNOSIS — K439 Ventral hernia without obstruction or gangrene: Secondary | ICD-10-CM | POA: Insufficient documentation

## 2018-01-13 DIAGNOSIS — Z79899 Other long term (current) drug therapy: Secondary | ICD-10-CM | POA: Insufficient documentation

## 2018-01-13 HISTORY — PX: EPIGASTRIC HERNIA REPAIR: SHX404

## 2018-01-13 SURGERY — REPAIR, HERNIA, EPIGASTRIC, ADULT
Anesthesia: General | Site: Abdomen

## 2018-01-13 MED ORDER — CHLORHEXIDINE GLUCONATE CLOTH 2 % EX PADS: 6.0000 | MEDICATED_PAD | Freq: Once | CUTANEOUS | Status: DC

## 2018-01-13 MED ORDER — MIDAZOLAM HCL 2 MG/2ML IJ SOLN
INTRAMUSCULAR | Status: AC
  Filled 2018-01-13: qty 2

## 2018-01-13 MED ORDER — SUGAMMADEX SODIUM 500 MG/5ML IV SOLN
INTRAVENOUS | Status: DC | PRN
  Administered 2018-01-13: 300 mg via INTRAVENOUS

## 2018-01-13 MED ORDER — CELECOXIB 200 MG PO CAPS
ORAL_CAPSULE | ORAL | Status: AC
  Filled 2018-01-13: qty 1

## 2018-01-13 MED ORDER — OXYCODONE HCL 5 MG PO TABS: 5.0000 mg | ORAL_TABLET | Freq: Once | ORAL | Status: DC | PRN

## 2018-01-13 MED ORDER — PROPOFOL 500 MG/50ML IV EMUL
INTRAVENOUS | Status: AC
  Filled 2018-01-13: qty 100

## 2018-01-13 MED ORDER — FENTANYL CITRATE (PF) 100 MCG/2ML IJ SOLN
INTRAMUSCULAR | Status: AC
  Filled 2018-01-13: qty 2

## 2018-01-13 MED ORDER — FENTANYL CITRATE (PF) 100 MCG/2ML IJ SOLN
50.0000 ug | INTRAMUSCULAR | Status: DC | PRN
  Administered 2018-01-13: 100 ug via INTRAVENOUS

## 2018-01-13 MED ORDER — HYDROMORPHONE HCL 1 MG/ML IJ SOLN: 0.2500 mg | INTRAMUSCULAR | Status: DC | PRN

## 2018-01-13 MED ORDER — SUGAMMADEX SODIUM 500 MG/5ML IV SOLN
INTRAVENOUS | Status: AC
  Filled 2018-01-13: qty 5

## 2018-01-13 MED ORDER — LACTATED RINGERS IV SOLN
INTRAVENOUS | Status: DC
  Administered 2018-01-13: 10:00:00 via INTRAVENOUS

## 2018-01-13 MED ORDER — GABAPENTIN 300 MG PO CAPS
ORAL_CAPSULE | ORAL | Status: AC
  Filled 2018-01-13: qty 1

## 2018-01-13 MED ORDER — KETOROLAC TROMETHAMINE 30 MG/ML IJ SOLN
INTRAMUSCULAR | Status: DC | PRN
  Administered 2018-01-13: 30 mg via INTRAVENOUS

## 2018-01-13 MED ORDER — BUPIVACAINE-EPINEPHRINE 0.25% -1:200000 IJ SOLN
INTRAMUSCULAR | Status: DC | PRN
  Administered 2018-01-13: 20 mL

## 2018-01-13 MED ORDER — CEFAZOLIN SODIUM-DEXTROSE 2-4 GM/100ML-% IV SOLN
INTRAVENOUS | Status: AC
  Filled 2018-01-13: qty 100

## 2018-01-13 MED ORDER — ONDANSETRON HCL 4 MG/2ML IJ SOLN
INTRAMUSCULAR | Status: DC | PRN
  Administered 2018-01-13: 4 mg via INTRAVENOUS

## 2018-01-13 MED ORDER — LIDOCAINE HCL (CARDIAC) PF 100 MG/5ML IV SOSY
PREFILLED_SYRINGE | INTRAVENOUS | Status: DC | PRN
  Administered 2018-01-13: 60 mg via INTRAVENOUS

## 2018-01-13 MED ORDER — CEFAZOLIN SODIUM-DEXTROSE 2-4 GM/100ML-% IV SOLN
2.0000 g | INTRAVENOUS | Status: AC
  Administered 2018-01-13: 2 g via INTRAVENOUS

## 2018-01-13 MED ORDER — MIDAZOLAM HCL 2 MG/2ML IJ SOLN
1.0000 mg | INTRAMUSCULAR | Status: DC | PRN
  Administered 2018-01-13: 2 mg via INTRAVENOUS

## 2018-01-13 MED ORDER — SCOPOLAMINE 1 MG/3DAYS TD PT72
1.0000 | MEDICATED_PATCH | Freq: Once | TRANSDERMAL | Status: AC | PRN
  Administered 2018-01-13: 1 via TRANSDERMAL

## 2018-01-13 MED ORDER — ONDANSETRON 4 MG PO TBDP: 4.0000 mg | ORAL_TABLET | Freq: Three times a day (TID) | ORAL | 0 refills | Status: DC | PRN

## 2018-01-13 MED ORDER — PROPOFOL 10 MG/ML IV BOLUS
INTRAVENOUS | Status: DC | PRN
  Administered 2018-01-13: 150 mg via INTRAVENOUS

## 2018-01-13 MED ORDER — BUPIVACAINE-EPINEPHRINE (PF) 0.25% -1:200000 IJ SOLN
INTRAMUSCULAR | Status: AC
  Filled 2018-01-13: qty 60

## 2018-01-13 MED ORDER — OXYCODONE HCL 5 MG/5ML PO SOLN: 5.0000 mg | Freq: Once | ORAL | Status: DC | PRN

## 2018-01-13 MED ORDER — ROCURONIUM BROMIDE 100 MG/10ML IV SOLN
INTRAVENOUS | Status: DC | PRN
  Administered 2018-01-13: 30 mg via INTRAVENOUS

## 2018-01-13 MED ORDER — ACETAMINOPHEN 500 MG PO TABS
ORAL_TABLET | ORAL | Status: AC
  Filled 2018-01-13: qty 2

## 2018-01-13 MED ORDER — DEXAMETHASONE SODIUM PHOSPHATE 4 MG/ML IJ SOLN
INTRAMUSCULAR | Status: DC | PRN
  Administered 2018-01-13: 10 mg via INTRAVENOUS

## 2018-01-13 MED ORDER — GABAPENTIN 300 MG PO CAPS
300.0000 mg | ORAL_CAPSULE | ORAL | Status: AC
  Administered 2018-01-13: 300 mg via ORAL

## 2018-01-13 MED ORDER — PROMETHAZINE HCL 25 MG/ML IJ SOLN: 6.2500 mg | INTRAMUSCULAR | Status: DC | PRN

## 2018-01-13 MED ORDER — ACETAMINOPHEN 500 MG PO TABS
1000.0000 mg | ORAL_TABLET | ORAL | Status: AC
  Administered 2018-01-13: 1000 mg via ORAL

## 2018-01-13 MED ORDER — CELECOXIB 200 MG PO CAPS
200.0000 mg | ORAL_CAPSULE | ORAL | Status: AC
  Administered 2018-01-13: 200 mg via ORAL

## 2018-01-13 MED ORDER — OXYCODONE HCL 5 MG PO TABS: 5.0000 mg | ORAL_TABLET | Freq: Four times a day (QID) | ORAL | 0 refills | Status: DC | PRN

## 2018-01-13 MED FILL — oxyCODONE HCL 5 MG TABS: 5 | 5 days supply | Qty: 20 | Fill #0

## 2018-01-13 MED FILL — ONDANSETRON ODT 4 MG TABLET: 4 | 6 days supply | Qty: 20 | Fill #0

## 2018-01-13 SURGICAL SUPPLY — 50 items
BENZOIN TINCTURE PRP APPL 2/3 (GAUZE/BANDAGES/DRESSINGS) IMPLANT
BLADE CLIPPER SURG (BLADE) IMPLANT
BLADE SURG 10 STRL SS (BLADE) ×4 IMPLANT
BLADE SURG 15 STRL LF DISP TIS (BLADE) ×4 IMPLANT
BLADE SURG 15 STRL SS (BLADE) ×4
CANISTER SUCT 1200ML W/VALVE (MISCELLANEOUS) ×4 IMPLANT
CHLORAPREP W/TINT 26ML (MISCELLANEOUS) ×4 IMPLANT
CLEANER CAUTERY TIP 5X5 PAD (MISCELLANEOUS) ×2 IMPLANT
CLOSURE WOUND 1/2 X4 (GAUZE/BANDAGES/DRESSINGS)
COVER BACK TABLE 60X90IN (DRAPES) ×4 IMPLANT
COVER MAYO STAND STRL (DRAPES) ×4 IMPLANT
DECANTER SPIKE VIAL GLASS SM (MISCELLANEOUS) ×4 IMPLANT
DRAPE LAPAROTOMY T 102X78X121 (DRAPES) ×4 IMPLANT
DRAPE UTILITY XL STRL (DRAPES) ×4 IMPLANT
DRSG TEGADERM 4X4.75 (GAUZE/BANDAGES/DRESSINGS) ×4 IMPLANT
ELECT REM PT RETURN 9FT ADLT (ELECTROSURGICAL) ×4
ELECTRODE REM PT RTRN 9FT ADLT (ELECTROSURGICAL) ×2 IMPLANT
GAUZE SPONGE 4X4 12PLY STRL LF (GAUZE/BANDAGES/DRESSINGS) ×8 IMPLANT
GLOVE BIO SURGEON STRL SZ8 (GLOVE) ×4 IMPLANT
GLOVE BIOGEL PI IND STRL 7.5 (GLOVE) ×2 IMPLANT
GLOVE BIOGEL PI INDICATOR 7.5 (GLOVE) ×2
GOWN STRL REUS W/ TWL LRG LVL3 (GOWN DISPOSABLE) ×2 IMPLANT
GOWN STRL REUS W/TWL LRG LVL3 (GOWN DISPOSABLE) ×2
NEEDLE HYPO 25X1 1.5 SAFETY (NEEDLE) ×4 IMPLANT
NEEDLE SPNL 25GX3.5 QUINCKE BL (NEEDLE) IMPLANT
NS IRRIG 1000ML POUR BTL (IV SOLUTION) ×4 IMPLANT
PACK BASIN DAY SURGERY FS (CUSTOM PROCEDURE TRAY) ×4 IMPLANT
PAD CLEANER CAUTERY TIP 5X5 (MISCELLANEOUS) ×2
PENCIL BUTTON HOLSTER BLD 10FT (ELECTRODE) ×4 IMPLANT
SLEEVE SCD COMPRESS KNEE MED (MISCELLANEOUS) ×4 IMPLANT
SPONGE LAP 18X18 RF (DISPOSABLE) ×4 IMPLANT
STAPLER VISISTAT 35W (STAPLE) IMPLANT
STRIP CLOSURE SKIN 1/2X4 (GAUZE/BANDAGES/DRESSINGS) IMPLANT
SUT MNCRL AB 4-0 PS2 18 (SUTURE) ×4 IMPLANT
SUT PROLENE 0 CT 1 CR/8 (SUTURE) IMPLANT
SUT PROLENE 2 0 CT2 30 (SUTURE) IMPLANT
SUT SILK 2 0 TIES 17X18 (SUTURE)
SUT SILK 2-0 18XBRD TIE BLK (SUTURE) IMPLANT
SUT SURG 0 T 19/GS 22 1969 62 (SUTURE) IMPLANT
SUT VIC AB 3-0 SH 27 (SUTURE) ×2
SUT VIC AB 3-0 SH 27X BRD (SUTURE) ×2 IMPLANT
SUT VIC AB 4-0 BRD 54 (SUTURE) IMPLANT
SUT VIC AB 4-0 SH 18 (SUTURE) IMPLANT
SYR BULB 3OZ (MISCELLANEOUS) ×4 IMPLANT
SYR CONTROL 10ML LL (SYRINGE) ×4 IMPLANT
TOWEL GREEN STERILE FF (TOWEL DISPOSABLE) ×4 IMPLANT
TOWEL OR NON WOVEN STRL DISP B (DISPOSABLE) ×4 IMPLANT
TUBE CONNECTING 20'X1/4 (TUBING) ×1
TUBE CONNECTING 20X1/4 (TUBING) ×3 IMPLANT
YANKAUER SUCT BULB TIP NO VENT (SUCTIONS) ×4 IMPLANT

## 2018-01-13 NOTE — Anesthesia Postprocedure Evaluation (Signed)
Anesthesia Post Note  Patient: Melissa Mejia  Procedure(s) Performed: PRIMARY REPAIR OF EPIGASTRIC VENTRAL HERNIA, ERAS PATHWAY (N/A Abdomen)     Patient location during evaluation: PACU Anesthesia Type: General Level of consciousness: awake and alert Pain management: pain level controlled Vital Signs Assessment: post-procedure vital signs reviewed and stable Respiratory status: spontaneous breathing, nonlabored ventilation, respiratory function stable and patient connected to nasal cannula oxygen Cardiovascular status: blood pressure returned to baseline and stable Postop Assessment: no apparent nausea or vomiting Anesthetic complications: no    Last Vitals:  Vitals:   01/13/18 1155 01/13/18 1246  BP:  (!) 155/92  Pulse: 70 67  Resp: 16 18  Temp:  36.8 C  SpO2: 100% 98%    Last Pain:  Vitals:   01/13/18 1246  TempSrc:   PainSc: 0-No pain                 Dilara Navarrete P Theoplis Garciagarcia

## 2018-01-13 NOTE — Anesthesia Preprocedure Evaluation (Addendum)
Anesthesia Evaluation  Patient identified by MRN, date of birth, ID band Patient awake    Reviewed: Allergy & Precautions, NPO status , Patient's Chart, lab work & pertinent test results  Airway Mallampati: II  TM Distance: >3 FB Neck ROM: Full    Dental no notable dental hx.    Pulmonary former smoker,    Pulmonary exam normal breath sounds clear to auscultation       Cardiovascular negative cardio ROS Normal cardiovascular exam Rhythm:Regular Rate:Normal  ECG: SR, rate 68   Neuro/Psych PSYCHIATRIC DISORDERS ADD (attention deficit disorder)negative neurological ROS     GI/Hepatic Neg liver ROS, GERD  Controlled,  Endo/Other  negative endocrine ROS  Renal/GU negative Renal ROS     Musculoskeletal negative musculoskeletal ROS (+)   Abdominal (+) + obese,   Peds  Hematology negative hematology ROS (+)   Anesthesia Other Findings Epigastric hernia  Reproductive/Obstetrics                            Anesthesia Physical Anesthesia Plan  ASA: II  Anesthesia Plan: General   Post-op Pain Management:    Induction: Intravenous  PONV Risk Score and Plan: 3 and Midazolam, Dexamethasone, Ondansetron, Treatment may vary due to age or medical condition and Scopolamine patch - Pre-op  Airway Management Planned: Oral ETT  Additional Equipment:   Intra-op Plan:   Post-operative Plan: Extubation in OR  Informed Consent: I have reviewed the patients History and Physical, chart, labs and discussed the procedure including the risks, benefits and alternatives for the proposed anesthesia with the patient or authorized representative who has indicated his/her understanding and acceptance.   Dental advisory given  Plan Discussed with: CRNA  Anesthesia Plan Comments:        Anesthesia Quick Evaluation

## 2018-01-13 NOTE — Interval H&P Note (Signed)
History and Physical Interval Note:  01/13/2018 10:12 AM  Melissa Mejia  has presented today for surgery, with the diagnosis of Epigastric hernia  The various methods of treatment have been discussed with the patient and family. After consideration of risks, benefits and other options for treatment, the patient has consented to  Procedure(s): [place] (N/A) INSERTION OF MESH (N/A) as a surgical intervention .  The patient's history has been reviewed, patient examined, no change in status, stable for surgery.  I have reviewed the patient's chart and labs.  Questions were answered to the patient's satisfaction.     [name]

## 2018-01-13 NOTE — Transfer of Care (Signed)
Immediate Anesthesia Transfer of Care Note  Patient: Melissa Mejia  Procedure(s) Performed: PRIMARY REPAIR OF EPIGASTRIC VENTRAL HERNIA, ERAS PATHWAY (N/A Abdomen)  Patient Location: PACU  Anesthesia Type:General  Level of Consciousness: awake, alert  and oriented  Airway & Oxygen Therapy: Patient Spontanous Breathing and Patient connected to face mask oxygen  Post-op Assessment: Report given to RN and Post -op Vital signs reviewed and stable  Post vital signs: Reviewed and stable  Last Vitals:  Vitals Value Taken Time  BP 157/106 01/13/2018 11:21 AM  Temp    Pulse 91 01/13/2018 11:22 AM  Resp 16 01/13/2018 11:22 AM  SpO2 100 % 01/13/2018 11:22 AM  Vitals shown include unvalidated device data.  Last Pain:  Vitals:   01/13/18 0932  TempSrc: Oral  PainSc:          Complications: No apparent anesthesia complications

## 2018-01-13 NOTE — Op Note (Signed)
Ventral Hernia Repair Procedure Note  Indications: Symptomatic epigastric ventral hernia  Pre-operative Diagnosis: Epigastric ventral hernia  Post-operative Diagnosis: Epigastric ventral hernia  Surgeon: [name]   Assistants: none  Anesthesia: Gen ETT  ASA Class: 1  Procedure Details  The patient was seen in the Holding Room. The risks, benefits, complications, treatment options, and expected outcomes were discussed with the patient. The possibilities of reaction to medication, pulmonary aspiration, perforation of viscus, bleeding, recurrent infection, the need for additional procedures, failure to diagnose a condition, and creating a complication requiring transfusion or operation were discussed with the patient. The patient concurred with the proposed plan, giving informed consent.  The site of surgery properly noted/marked. The patient was taken to the operating room, identified as Melissa Mejia and the procedure verified as ventral hernia repair. A Time Out was held and the above information confirmed.  The patient was placed supine.  After establishing general anesthesia, the abdomen was prepped with Chloraprep and draped in sterile fashion.  We made a vertical incision over the palpable hernia in the epigastrium after infiltrating with local anesthetic.Marland Kitchen Dissection was carried down to the hernia sac located above the fascia and mobilized from surrounding structures.  Intact fascia was identified circumferentially around the defect.  The hernia sac was opened and contained only some preperitoneal fat with no sign of bowel.  The hernia sac was excised.  The hernia defect measured only about 9 mm across.  I made the decision not to use mesh.  The fascial defect was reapproximated with interrupted figure-of-8 1 Novofil sutures.  The subcutaneous tissues were irrigated and closed with 3-0 Vicryl.  Hemostasis was confirmed.  The skin incision was closed with a 4-0 Vicryl subcuticular  closure.  Steri-Strips were applied at the end of the operation.    Instrument, sponge, and needle counts were correct prior to closure and at the conclusion of the case.   Findings: 9 mm fascial defect containing only some preperitoneal fat  Estimated Blood Loss:  Minimal         Drains: none                      Complications:  None; patient tolerated the procedure well.         Disposition: PACU - hemodynamically stable.         Condition: stable  Melissa Burn. Georgette Dover, MD, [facility]  General/ Trauma Surgery  01/13/2018 11:17 AM

## 2018-01-13 NOTE — Discharge Instructions (Signed)
[facility], [place]  HERNIA REPAIR: POST OP INSTRUCTIONS  Always review your discharge instruction sheet given to you by the facility where your surgery was performed. IF YOU HAVE DISABILITY OR FAMILY LEAVE FORMS, YOU MUST BRING THEM TO THE OFFICE FOR PROCESSING.   DO NOT GIVE THEM TO YOUR DOCTOR.  1. A  prescription for pain medication may be given to you upon discharge.  Take your pain medication as prescribed, if needed.  If narcotic pain medicine is not needed, then you may take acetaminophen (Tylenol) or ibuprofen (Advil) as needed. 2. Take your usually prescribed medications unless otherwise directed. 3. If you need a refill on your pain medication, please contact your pharmacy.  They will contact our office to request authorization. Prescriptions will not be filled after 5 pm or on week-ends. 4. You should follow a light diet the first 24 hours after arrival home, such as soup and crackers, etc.  Be sure to include lots of fluids daily.  Resume your normal diet the day after surgery. 5. Most patients will experience some swelling and bruising around the incision.  Ice packs and reclining will help.  Swelling and bruising can take several days to resolve.  6. It is common to experience some constipation if taking pain medication after surgery.  Increasing fluid intake and taking a stool softener (such as Colace) will usually help or prevent this problem from occurring.  A mild laxative (Milk of Magnesia or Miralax) should be taken according to package directions if there are no bowel movements after 48 hours. 7. Unless discharge instructions indicate otherwise, you may remove your bandages 24-48 hours after surgery, and you may shower at that time.  You will have steri-strips (small skin tapes) in place directly over the incision.  These strips should be left on the skin for 7-10 days. 8. ACTIVITIES:  You may resume regular (light) daily activities beginning the next day--such as daily  self-care, walking, climbing stairs--gradually increasing activities as tolerated.  You may have sexual intercourse when it is comfortable.  Refrain from any heavy lifting or straining until approved by your doctor. a. You may drive when you are no longer taking prescription pain medication, you can comfortably wear a seatbelt, and you can safely maneuver your car and apply brakes. b. RETURN TO WORK:  2-3 weeks with light duty - no lifting over 15 lbs. 9. You should see your doctor in the office for a follow-up appointment approximately 2-3 weeks after your surgery.  Make sure that you call for this appointment within a day or two after you arrive home to insure a convenient appointment time. 10. OTHER INSTRUCTIONS:  __________________________________________________________________________________________________________________________________________________________________________________________  WHEN TO CALL YOUR DOCTOR: 1. Fever over 101.0 2. Inability to urinate 3. Nausea and/or vomiting 4. Extreme swelling or bruising 5. Continued bleeding from incision. 6. Increased pain, redness, or drainage from the incision  The clinic staff is available to answer your questions during regular business hours.  Please dont hesitate to call and ask to speak to one of the nurses for clinical concerns.  If you have a medical emergency, go to the nearest emergency room or call 911.  A surgeon from [facility] is always on call at the hospital   [address], [place], [place], [place]  [address] ?  [address], [place], [place]   [address] [phone]    FAX [phone] Web site: [email or web address]   Post Anesthesia Home Care Instructions  Activity: Get plenty of rest for the remainder of the day. A responsible individual must stay with you for 24 hours following the procedure.  For the next 24 hours, DO NOT: -Drive a car -Conservation officer, nature -Drink alcoholic beverages -Take any medication unless instructed by your physician -Make any legal decisions or sign important papers.  Meals: Start with liquid foods such as gelatin or soup. Progress to regular foods as tolerated. Avoid greasy, spicy, heavy foods. If nausea and/or vomiting occur, drink only clear liquids until the nausea and/or vomiting subsides. Call your physician if vomiting continues.  Special Instructions/Symptoms: Your throat may feel dry or sore from the anesthesia or the breathing tube placed in your throat during surgery. If this causes discomfort, gargle with warm salt water. The discomfort should disappear within 24 hours.  If you had a scopolamine patch placed behind your ear for the management of post- operative nausea and/or vomiting:  1. The medication in the patch is effective for 72 hours, after which it should be removed.  Wrap patch in a tissue and discard in the trash. Wash hands thoroughly with soap and water. 2. You may remove the patch earlier than 72 hours if you experience unpleasant side effects which may include dry mouth, dizziness or visual disturbances. 3. Avoid touching the patch. Wash your hands with soap and water after contact with the patch.

## 2018-01-13 NOTE — Anesthesia Procedure Notes (Signed)
Procedure Name: Intubation Date/Time: 01/13/2018 10:30 AM Performed by: Signe Colt, CRNA Pre-anesthesia Checklist: Patient identified, Emergency Drugs available, Suction available and Patient being monitored Patient Re-evaluated:Patient Re-evaluated prior to induction Oxygen Delivery Method: Circle system utilized Preoxygenation: Pre-oxygenation with 100% oxygen Induction Type: IV induction Ventilation: Mask ventilation without difficulty Laryngoscope Size: Mac and 3 Grade View: Grade I Tube type: Oral Tube size: 7.0 mm Number of attempts: 1 Airway Equipment and Method: Stylet and Oral airway Placement Confirmation: ETT inserted through vocal cords under direct vision,  positive ETCO2 and breath sounds checked- equal and bilateral Secured at: 21 cm Tube secured with: Tape Dental Injury: Teeth and Oropharynx as per pre-operative assessment

## 2018-01-14 ENCOUNTER — Encounter (HOSPITAL_BASED_OUTPATIENT_CLINIC_OR_DEPARTMENT_OTHER): Payer: Self-pay | Admitting: Surgery

## 2018-01-15 ENCOUNTER — Ambulatory Visit: Payer: No Typology Code available for payment source | Admitting: Physical Therapy

## 2018-01-19 ENCOUNTER — Encounter: Payer: Self-pay | Admitting: Physical Therapy

## 2018-01-19 ENCOUNTER — Ambulatory Visit: Payer: No Typology Code available for payment source | Attending: Family | Admitting: Physical Therapy

## 2018-01-19 DIAGNOSIS — R252 Cramp and spasm: Secondary | ICD-10-CM | POA: Insufficient documentation

## 2018-01-19 DIAGNOSIS — M6281 Muscle weakness (generalized): Secondary | ICD-10-CM | POA: Insufficient documentation

## 2018-01-19 DIAGNOSIS — R262 Difficulty in walking, not elsewhere classified: Secondary | ICD-10-CM | POA: Diagnosis present

## 2018-01-19 DIAGNOSIS — M79672 Pain in left foot: Secondary | ICD-10-CM | POA: Diagnosis present

## 2018-01-19 DIAGNOSIS — M79671 Pain in right foot: Secondary | ICD-10-CM | POA: Insufficient documentation

## 2018-01-19 DIAGNOSIS — M542 Cervicalgia: Secondary | ICD-10-CM | POA: Diagnosis not present
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
## 2018-01-22 ENCOUNTER — Ambulatory Visit: Payer: No Typology Code available for payment source | Admitting: Physical Therapy

## 2018-01-26 ENCOUNTER — Encounter: Payer: Self-pay | Admitting: Physical Therapy

## 2018-01-26 ENCOUNTER — Ambulatory Visit: Payer: No Typology Code available for payment source | Admitting: Physical Therapy

## 2018-01-26 DIAGNOSIS — M6281 Muscle weakness (generalized): Secondary | ICD-10-CM

## 2018-01-26 DIAGNOSIS — M79672 Pain in left foot: Secondary | ICD-10-CM

## 2018-01-26 DIAGNOSIS — R252 Cramp and spasm: Secondary | ICD-10-CM

## 2018-01-26 DIAGNOSIS — R262 Difficulty in walking, not elsewhere classified: Secondary | ICD-10-CM

## 2018-01-26 DIAGNOSIS — M542 Cervicalgia: Secondary | ICD-10-CM

## 2018-01-26 DIAGNOSIS — M79671 Pain in right foot: Secondary | ICD-10-CM

## 2018-01-26 NOTE — Therapy (Signed)
[facility] [address]  [place], [place], [address] Phone: [phone]   Fax:  [phone]  Physical Therapy Treatment  Patient Details  Name: Melissa Mejia MRN: [id number] Date of Birth: 1966-07-19 Referring Provider: Debbrah Alar, NP   Encounter Date: 01/26/2018  PT End of Session - 01/26/18 0851    Visit Number  3    Number of Visits  12    Date for PT Re-Evaluation  03/02/18    Authorization Type  [facility]    PT Start Time  0851    PT Stop Time  0934    PT Time Calculation (min)  43 min    Activity Tolerance  Patient tolerated treatment well    Behavior During Therapy  [facility] for tasks assessed/performed       Past Medical History:  Diagnosis Date  . ADD (attention deficit disorder)   . History of esophageal reflux   . Right ACL tear   . Wears contact lenses     Past Surgical History:  Procedure Laterality Date  . EPIGASTRIC HERNIA REPAIR N/A 01/13/2018   Procedure: PRIMARY REPAIR OF EPIGASTRIC VENTRAL HERNIA, ERAS PATHWAY;  Surgeon: Donnie Mesa, MD;  Location: [facility];  Service: General;  Laterality: N/A;  . KNEE ARTHROSCOPY WITH ANTERIOR CRUCIATE LIGAMENT (ACL) REPAIR WITH HAMSTRING GRAFT Left 02/09/2014   Procedure: LEFT KNEE ARTHROSCOPY WITH ALLOGRAFT ANTERIOR CRUCIATE LIGAMENT (ACL) HAMSTRING RECONSTRUCTION ;  Surgeon: Sydnee Cabal, MD;  Location: [facility];  Service: Orthopedics;  Laterality: Left;  . SHOULDER ARTHROSCOPY W/ LABRAL REPAIR Left 06/08/2007  . SHOULDER SURGERY Left 2008   labrum repair  . WISDOM TOOTH EXTRACTION      There were no vitals filed for this visit.  Subjective Assessment - 01/26/18 0853    Currently in Pain?  Yes    Pain Score  3     Pain Location  Neck & shoulders    Pain Orientation  Right;Left    Pain Descriptors / Indicators  Tightness    Pain Type  Acute pain    Pain Frequency  Intermittent    Pain Score  2     Pain Location  Foot    Pain Orientation  Right;Left    Pain Descriptors / Indicators  Sore;Tender;Aching    Pain Type  Chronic pain                       [facility] Adult PT Treatment/Exercise - 01/26/18 0851      Modalities   Modalities  Ultrasound      Ultrasound   Ultrasound Location  B plantar fascia    Ultrasound Parameters  3.3Hz , 1.0 W/cm2 x 5' each    Ultrasound Goals  Pain      Manual Therapy   Manual Therapy  Soft tissue mobilization;Myofascial release;Passive ROM    Manual therapy comments  prone    Soft tissue mobilization  STM & cross friction massage to B plantar fascia    Myofascial Release  B plantar fascia    Passive ROM  manual stretch to B plantar fascia      Ankle Exercises: Aerobic   Recumbent Bike  L2 x 6'      Ankle Exercises: Seated   Marble Pickup  B feet x 20 marbles    Other Seated Ankle Exercises  B resisted toes curls in long-stting with red TB x15 each  Ankle Exercises: Stretches   Plantar Fascia Stretch  30 seconds;2 reps    Plantar Fascia Stretch Limitations  manual self-stretch               PT Short Term Goals - 01/19/18 0936      PT SHORT TERM GOAL #1   Title  Independent with initial HEP    Status  On-going      PT SHORT TERM GOAL #2   Title  B foot and ankle DF & eversion AROM WFL to allow for normal gait mechanics    Status  On-going      PT SHORT TERM GOAL #3   Title  Pt will verbalize/demonstrate understanding of neutral cervical spine and shoulder posture to reduce cervical strain with daily chores and work tasks    Status  On-going        PT Long Term Goals - 01/19/18 0937      PT LONG TERM GOAL #1   Title  Independent with ongoing HEP     Status  On-going      PT LONG TERM GOAL #2   Title  Pt will report reduction in cervical muscle tension by >/= 75%to allow improved tolerance for her work day    Status  On-going      PT LONG TERM GOAL #3   Title  Pt will report >/= 50% decrease in  pain in B plantar fascia with rising in the morning and on completion of work shift      Status  On-going      PT LONG TERM GOAL #4   Title  B foot and ankle strength >/= 4+/5 for improved stabililty and work tolerance    Status  On-going      PT LONG TERM GOAL #5   Title  Pt will verbalize understanding of ways to manage her pain, muscle tension and elongation of cervical muscles in case of relapse    Status  On-going            Plan - 01/26/18 0856    Clinical Impression Statement  Today's session focusing on B plantar fasciitis with pt notign some relief from ice water bottle rolling at home. Significant crepitius present in B plantar fasica with pt noting greatest tenderness in mid fascia/quadratus plantaris - addressed with [place] followed by manual STM/MFR with decrease in muscel tension and crepitus noted and pt reporting decrease in pain. Exercises focusing on foot intrinsic muscle strengthening with good tolerance - pt provided with red TB for carryover at home.    Rehab Potential  Good    Clinical Impairments Affecting Rehab Potential  surgery for hernia repair on 01/13/18    PT Treatment/Interventions  Patient/family education;ADLs/Self Care Home Management;Therapeutic exercise;Therapeutic activities;Balance training;Manual techniques;Dry needling;Taping;Electrical Stimulation;Moist Heat;Traction;Ultrasound;Iontophoresis 4mg /ml Dexamethasone;Cryotherapy;Vasopneumatic Device    Consulted and Agree with Plan of Care  Patient       Patient will benefit from skilled therapeutic intervention in order to improve the following deficits and impairments:  Pain, Increased muscle spasms, Postural dysfunction, Impaired flexibility, Decreased strength, Difficulty walking, Decreased activity tolerance  Visit Diagnosis: Cervicalgia  Pain in left foot  Pain in right foot  Cramp and spasm  Muscle weakness (generalized)  Difficulty in walking, not elsewhere classified     Problem  List Patient Active Problem List   Diagnosis Date Noted  . Trapezius muscle strain 12/23/2016  . Left shoulder pain 12/23/2016  . ADHD 11/14/2016  . GERD (gastroesophageal reflux disease) 11/14/2016  .  S/P ACL reconstruction 02/09/2014  . Left knee injury 01/03/2014    Percival Spanish, PT, MPT 01/26/2018, 2:37 PM  [facility] [address]  [address] [place], [place], [address] Phone: [phone]   Fax:  [phone]  Name: JOSPHINE LAFFEY MRN: [id number] Date of Birth: 14-Sep-1965

## 2018-01-29 ENCOUNTER — Encounter: Payer: Self-pay | Admitting: Physical Therapy

## 2018-01-29 ENCOUNTER — Ambulatory Visit: Payer: No Typology Code available for payment source | Admitting: Physical Therapy

## 2018-01-29 DIAGNOSIS — M542 Cervicalgia: Secondary | ICD-10-CM

## 2018-01-29 DIAGNOSIS — M79671 Pain in right foot: Secondary | ICD-10-CM

## 2018-01-29 DIAGNOSIS — M79672 Pain in left foot: Secondary | ICD-10-CM

## 2018-01-29 DIAGNOSIS — M6281 Muscle weakness (generalized): Secondary | ICD-10-CM

## 2018-01-29 DIAGNOSIS — R252 Cramp and spasm: Secondary | ICD-10-CM

## 2018-01-29 DIAGNOSIS — R262 Difficulty in walking, not elsewhere classified: Secondary | ICD-10-CM

## 2018-01-29 NOTE — Therapy (Signed)
[facility] [address]  [place], [place], [address] Phone: [phone]   Fax:  [phone]  Physical Therapy Treatment  Patient Details  Name: Melissa Mejia MRN: [id number] Date of Birth: 05-02-1966 Referring Provider: Debbrah Alar, NP   Encounter Date: 01/29/2018  PT End of Session - 01/29/18 1018    Visit Number  4    Number of Visits  12    Date for PT Re-Evaluation  03/02/18    Authorization Type  [facility]    PT Start Time  1018    PT Stop Time  1117    PT Time Calculation (min)  59 min    Activity Tolerance  Patient tolerated treatment well    Behavior During Therapy  [facility] for tasks assessed/performed       Past Medical History:  Diagnosis Date  . ADD (attention deficit disorder)   . History of esophageal reflux   . Right ACL tear   . Wears contact lenses     Past Surgical History:  Procedure Laterality Date  . EPIGASTRIC HERNIA REPAIR N/A 01/13/2018   Procedure: PRIMARY REPAIR OF EPIGASTRIC VENTRAL HERNIA, ERAS PATHWAY;  Surgeon: Donnie Mesa, MD;  Location: [facility];  Service: General;  Laterality: N/A;  . KNEE ARTHROSCOPY WITH ANTERIOR CRUCIATE LIGAMENT (ACL) REPAIR WITH HAMSTRING GRAFT Left 02/09/2014   Procedure: LEFT KNEE ARTHROSCOPY WITH ALLOGRAFT ANTERIOR CRUCIATE LIGAMENT (ACL) HAMSTRING RECONSTRUCTION ;  Surgeon: Sydnee Cabal, MD;  Location: [facility];  Service: Orthopedics;  Laterality: Left;  . SHOULDER ARTHROSCOPY W/ LABRAL REPAIR Left 06/08/2007  . SHOULDER SURGERY Left 2008   labrum repair  . WISDOM TOOTH EXTRACTION      There were no vitals filed for this visit.  Subjective Assessment - 01/29/18 1023    Subjective  Pt reporting good relief in feet from last therapy session, but still notes tender spots. 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  Wishing to focus on shoulder/neck area today.    Currently in Pain?  Yes    Pain  Score  4     Pain Location  Neck & shoulders    Pain Orientation  Right;Left    Pain Descriptors / Indicators  Tightness    Pain Type  Acute pain    Pain Score  3    Pain Location  Foot    Pain Orientation  Right;Left    Pain Descriptors / Indicators  Sore;Tender    Pain Type  Chronic pain                       [facility] Adult PT Treatment/Exercise - 01/29/18 1018      Neck Exercises: Machines for Strengthening   UBE (Upper Arm Bike)  L3.0 x 6' (3' fwd/3' back)      Neck Exercises: Theraband   Scapula Retraction  10 reps;Red    Scapula Retraction Limitations  + alt UE diagonals; hoolyling on pool noodle    Horizontal ABduction  10 reps;Red    Horizontal ABduction Limitations  hooklying on pool noodle      Modalities   Modalities  Electrical Stimulation;Moist Heat      Moist Heat Therapy   Number Minutes Moist Heat  10 Minutes    Moist Heat Location  Cervical;Shoulder      Electrical Stimulation   Electrical Stimulation Location  B UT/LS    Electrical Stimulation Action  IFC    Electrical Stimulation Parameters  80-150 Hz, intensity to pt tol x10'    Electrical Stimulation Goals  Pain;Tone      Manual Therapy   Manual Therapy  Soft tissue mobilization;Myofascial release    Soft tissue mobilization  STM to B UT, LS & thoracic paraspinals    Myofascial Release  manual TPR to B UT       Trigger Point Dry Needling - 01/29/18 1018    Consent Given?  Yes    Muscles Treated Upper Body  Upper trapezius;Levator scapulae;Longissimus    Upper Trapezius Response  Twitch reponse elicited;Palpable increased muscle length Bil    Levator Scapulae Response  Twitch response elicited;Palpable increased muscle length Bil    Longissimus Response  Twitch response elicited;Palpable increased muscle length L lower thoracic             PT Short Term Goals - 01/29/18 1136      PT SHORT TERM GOAL #1   Title  Independent with initial HEP    Status  Achieved      PT  SHORT TERM GOAL #2   Title  B foot and ankle DF & eversion AROM WFL to allow for normal gait mechanics    Status  On-going      PT SHORT TERM GOAL #3   Title  Pt will verbalize/demonstrate understanding of neutral cervical spine and shoulder posture to reduce cervical strain with daily chores and work tasks    Status  On-going        PT Long Term Goals - 01/19/18 0937      PT LONG TERM GOAL #1   Title  Independent with ongoing HEP     Status  On-going      PT LONG TERM GOAL #2   Title  Pt will report reduction in cervical muscle tension by >/= 75%to allow improved tolerance for her work day    Status  On-going      PT LONG TERM GOAL #3   Title  Pt will report >/= 50% decrease in pain in B plantar fascia with rising in the morning and on completion of work shift      Status  On-going      PT LONG TERM GOAL #4   Title  B foot and ankle strength >/= 4+/5 for improved stabililty and work tolerance    Status  On-going      PT LONG TERM GOAL #5   Title  Pt will verbalize understanding of ways to manage her pain, muscle tension and elongation of cervical muscles in case of relapse    Status  On-going            Plan - 01/29/18 1027    Clinical Impression Statement  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 Increased muscle tension with ttp noted in B UT & LS as well L lower trap/thoracic paraspinals - responded well to manulal therapy including DN with reduction in muscle tension taut bands. Guided pt scapular muscle activation/stabilization with red TB hoolying on pool noodle for tactile input and to allow for pec stretch with good tolerance.    Rehab Potential  Good    Clinical Impairments Affecting Rehab Potential  surgery for hernia repair on 01/13/18    PT Treatment/Interventions  Patient/family education;ADLs/Self Care Home Management;Therapeutic exercise;Therapeutic activities;Balance  training;Manual techniques;Dry needling;Taping;Electrical Stimulation;Moist Heat;Traction;Ultrasound;Iontophoresis 4mg /ml Dexamethasone;Cryotherapy;Vasopneumatic Device    Consulted and  Agree with Plan of Care  Patient       Patient will benefit from skilled therapeutic intervention in order to improve the following deficits and impairments:  Pain, Increased muscle spasms, Postural dysfunction, Impaired flexibility, Decreased strength, Difficulty walking, Decreased activity tolerance  Visit Diagnosis: Cervicalgia  Pain in left foot  Pain in right foot  Cramp and spasm  Muscle weakness (generalized)  Difficulty in walking, not elsewhere classified     Problem List Patient Active Problem List   Diagnosis Date Noted  . Trapezius muscle strain 12/23/2016  . Left shoulder pain 12/23/2016  . ADHD 11/14/2016  . GERD (gastroesophageal reflux disease) 11/14/2016  . S/P ACL reconstruction 02/09/2014  . Left knee injury 01/03/2014    Percival Spanish, PT, MPT 01/29/2018, 11:38 AM  [facility] [address]  [place], [place], [address] Phone: [phone]   Fax:  [phone]  Name: Melissa Mejia MRN: [id number] Date of Birth: 1966-05-23

## 2018-02-02 ENCOUNTER — Ambulatory Visit: Payer: No Typology Code available for payment source | Admitting: Physical Therapy

## 2018-02-02 ENCOUNTER — Encounter: Payer: Self-pay | Admitting: Physical Therapy

## 2018-02-02 DIAGNOSIS — M542 Cervicalgia: Secondary | ICD-10-CM

## 2018-02-02 DIAGNOSIS — M6281 Muscle weakness (generalized): Secondary | ICD-10-CM

## 2018-02-02 DIAGNOSIS — M79671 Pain in right foot: Secondary | ICD-10-CM

## 2018-02-02 DIAGNOSIS — M79672 Pain in left foot: Secondary | ICD-10-CM

## 2018-02-02 DIAGNOSIS — R252 Cramp and spasm: Secondary | ICD-10-CM

## 2018-02-02 NOTE — Therapy (Signed)
[facility] [address]  [place], [place], [address] Phone: [phone]   Fax:  [phone]  Physical Therapy Treatment  Patient Details  Name: Melissa Mejia MRN: [id number] Date of Birth: [birth year] Referring Provider: Debbrah Alar, NP   Encounter Date: 02/02/2018  PT End of Session - 02/02/18 0858    Visit Number  5    Number of Visits  12    Date for PT Re-Evaluation  03/02/18    Authorization Type  [facility]    PT Start Time  [phone] pt arrived late    PT Stop Time  0942    PT Time Calculation (min)  44 min    Activity Tolerance  Patient tolerated treatment well    Behavior During Therapy  [facility] for tasks assessed/performed       Past Medical History:  Diagnosis Date  . ADD (attention deficit disorder)   . History of esophageal reflux   . Right ACL tear   . Wears contact lenses     Past Surgical History:  Procedure Laterality Date  . EPIGASTRIC HERNIA REPAIR N/A 01/13/2018   Procedure: PRIMARY REPAIR OF EPIGASTRIC VENTRAL HERNIA, ERAS PATHWAY;  Surgeon: Donnie Mesa, MD;  Location: [facility];  Service: General;  Laterality: N/A;  . KNEE ARTHROSCOPY WITH ANTERIOR CRUCIATE LIGAMENT (ACL) REPAIR WITH HAMSTRING GRAFT Left 02/09/2014   Procedure: LEFT KNEE ARTHROSCOPY WITH ALLOGRAFT ANTERIOR CRUCIATE LIGAMENT (ACL) HAMSTRING RECONSTRUCTION ;  Surgeon: Sydnee Cabal, MD;  Location: [facility];  Service: Orthopedics;  Laterality: Left;  . SHOULDER ARTHROSCOPY W/ LABRAL REPAIR Left 06/08/2007  . SHOULDER SURGERY Left 2008   labrum repair  . WISDOM TOOTH EXTRACTION      There were no vitals filed for this visit.  Subjective Assessment - 02/02/18 0859    Subjective  Pt reporting neck and shoulders are better since needling last visit. Noting increased sensitivity in feet this week and wishes to focus on her feet today.    Currently in Pain?  Yes    Pain Score  0-No  pain    Pain Location  Neck & shoulders    Pain Score  4    Pain Location  Foot    Pain Orientation  Left;Right    Pain Descriptors / Indicators  Sore;Tender    Pain Type  Chronic pain                       [facility] Adult PT Treatment/Exercise - 02/02/18 0858      Ultrasound   Ultrasound Location  B plantar fascia    Ultrasound Parameters  3.3Hz , 1.0 W/cm2 x 5' each    Ultrasound Goals  Pain      Manual Therapy   Manual Therapy  Soft tissue mobilization;Myofascial release;Passive ROM;Taping    Manual therapy comments  prone    Soft tissue mobilization  STM & cross friction massage to B plantar fascia    Myofascial Release  B plantar fascia    Passive ROM  manual stretch to B plantar fascia    Kinesiotex  Create Space      Kinesiotix   Create Space  B plantar fascia      Ankle Exercises: Aerobic   Nustep  L5 x 6' (LE only)               PT Short Term Goals - 01/29/18 1136  PT SHORT TERM GOAL #1   Title  Independent with initial HEP    Status  Achieved      PT SHORT TERM GOAL #2   Title  B foot and ankle DF & eversion AROM WFL to allow for normal gait mechanics    Status  On-going      PT SHORT TERM GOAL #3   Title  Pt will verbalize/demonstrate understanding of neutral cervical spine and shoulder posture to reduce cervical strain with daily chores and work tasks    Status  On-going        PT Long Term Goals - 01/19/18 0937      PT LONG TERM GOAL #1   Title  Independent with ongoing HEP     Status  On-going      PT LONG TERM GOAL #2   Title  Pt will report reduction in cervical muscle tension by >/= 75%to allow improved tolerance for her work day    Status  On-going      PT LONG TERM GOAL #3   Title  Pt will report >/= 50% decrease in pain in B plantar fascia with rising in the morning and on completion of work shift      Status  On-going      PT LONG TERM GOAL #4   Title  B foot and ankle strength >/= 4+/5 for improved stabililty  and work tolerance    Status  On-going      PT LONG TERM GOAL #5   Title  Pt will verbalize understanding of ways to manage her pain, muscle tension and elongation of cervical muscles in case of relapse    Status  On-going            Plan - 02/02/18 0949    Clinical Impression Statement  Tonyetta reporting good relief of neck and shoulder pain following manual therapy and DN last visit and has been able to keep up with HEP to keep pain controlled. 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    Rehab Potential  Good    Clinical Impairments Affecting Rehab Potential  surgery for hernia repair on 01/13/18    PT Treatment/Interventions  Patient/family education;ADLs/Self Care Home Management;Therapeutic exercise;Therapeutic activities;Balance training;Manual techniques;Dry needling;Taping;Electrical Stimulation;Moist Heat;Traction;Ultrasound;Iontophoresis 4mg /ml Dexamethasone;Cryotherapy;Vasopneumatic Device    Consulted and Agree with Plan of Care  Patient       Patient will benefit from skilled therapeutic intervention in order to improve the following deficits and impairments:  Pain, Increased muscle spasms, Postural dysfunction, Impaired flexibility, Decreased strength, Difficulty walking, Decreased activity tolerance  Visit Diagnosis: Cervicalgia  Pain in left foot  Pain in right foot  Cramp and spasm  Muscle weakness (generalized)     Problem List Patient Active Problem List   Diagnosis Date Noted  . Trapezius muscle strain 12/23/2016  . Left shoulder pain 12/23/2016  . ADHD 11/14/2016  . GERD (gastroesophageal reflux disease) 11/14/2016  . S/P ACL reconstruction 02/09/2014  . Left knee injury 01/03/2014    Percival Spanish, PT, MPT 02/02/2018, 9:55 AM  [facility] [address]  [place], [place], [address] Phone: [phone]   Fax:  [phone]  Name: ROME SCHLAUCH MRN:  [id number] Date of Birth: 10/24/1965

## 2018-02-04 ENCOUNTER — Ambulatory Visit: Payer: No Typology Code available for payment source | Admitting: Physical Therapy

## 2018-02-09 ENCOUNTER — Ambulatory Visit: Payer: No Typology Code available for payment source | Admitting: Physical Therapy

## 2018-02-11 ENCOUNTER — Encounter: Payer: Self-pay | Admitting: Physical Therapy

## 2018-02-11 ENCOUNTER — Ambulatory Visit: Payer: No Typology Code available for payment source | Admitting: Physical Therapy

## 2018-02-11 DIAGNOSIS — M6281 Muscle weakness (generalized): Secondary | ICD-10-CM

## 2018-02-11 DIAGNOSIS — M542 Cervicalgia: Secondary | ICD-10-CM

## 2018-02-11 DIAGNOSIS — R262 Difficulty in walking, not elsewhere classified: Secondary | ICD-10-CM

## 2018-02-11 DIAGNOSIS — R252 Cramp and spasm: Secondary | ICD-10-CM

## 2018-02-11 DIAGNOSIS — M79671 Pain in right foot: Secondary | ICD-10-CM

## 2018-02-11 DIAGNOSIS — M79672 Pain in left foot: Secondary | ICD-10-CM

## 2018-02-11 NOTE — Therapy (Addendum)
[facility] [address]  [place], [place], [address] Phone: [phone]   Fax:  [phone]  Physical Therapy Treatment  Patient Details  Name: Melissa Mejia MRN: [id number] Date of Birth: 1966-06-15 Referring Provider: Debbrah Alar, NP   Encounter Date: 02/11/2018  PT End of Session - 02/11/18 0851    Visit Number  6    Number of Visits  12    Date for PT Re-Evaluation  03/02/18    Authorization Type  [facility]    PT Start Time  0851    PT Stop Time  0937    PT Time Calculation (min)  46 min    Activity Tolerance  Patient tolerated treatment well    Behavior During Therapy  [facility] for tasks assessed/performed       Past Medical History:  Diagnosis Date  . ADD (attention deficit disorder)   . History of esophageal reflux   . Right ACL tear   . Wears contact lenses     Past Surgical History:  Procedure Laterality Date  . EPIGASTRIC HERNIA REPAIR N/A 01/13/2018   Procedure: PRIMARY REPAIR OF EPIGASTRIC VENTRAL HERNIA, ERAS PATHWAY;  Surgeon: Donnie Mesa, MD;  Location: [facility];  Service: General;  Laterality: N/A;  . KNEE ARTHROSCOPY WITH ANTERIOR CRUCIATE LIGAMENT (ACL) REPAIR WITH HAMSTRING GRAFT Left 02/09/2014   Procedure: LEFT KNEE ARTHROSCOPY WITH ALLOGRAFT ANTERIOR CRUCIATE LIGAMENT (ACL) HAMSTRING RECONSTRUCTION ;  Surgeon: Sydnee Cabal, MD;  Location: [facility];  Service: Orthopedics;  Laterality: Left;  . SHOULDER ARTHROSCOPY W/ LABRAL REPAIR Left 06/08/2007  . SHOULDER SURGERY Left 2008   labrum repair  . WISDOM TOOTH EXTRACTION      There were no vitals filed for this visit.  Subjective Assessment - 02/11/18 0858    Subjective  Pt noting benefit from taping last session lasting a few days. Has been working on "shoe situation" - has purchased new low profile [employer] and has added slight heel lift. 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    Currently in Pain?  Yes    Pain Score  5     Pain Location  Neck    Pain Orientation  Right;Left    Pain Descriptors / Indicators  Tightness    Pain Type  Acute pain    Pain Score  6    Pain Location  Foot    Pain Orientation  Left;Right    Pain Descriptors / Indicators  Sore;Dull;Aching    Pain Type  Chronic pain                       [facility] Adult PT Treatment/Exercise - 02/11/18 0851      Neck Exercises: Machines for Strengthening   UBE (Upper Arm Bike)  L3.0 x 6' (3' fwd/3' back)      Manual Therapy   Manual Therapy  Soft tissue mobilization;Myofascial release;Passive ROM;Taping    Manual therapy comments  prone    Soft tissue mobilization  STM to B UT, LS & thoracic paraspinals; STM & cross friction massage to B plantar fascia    Myofascial Release  manual TPR to B UT & LS, B plantar fascia    Passive ROM  manual stretch to B plantar fascia    Kinesiotex  Create Space      Kinesiotix  Create Space  B plantar fascia       Trigger Point Dry Needling - 02/11/18 8657    Consent Given?  Yes    Muscles Treated Upper Body  Upper trapezius;Levator scapulae    Upper Trapezius Response  Twitch reponse elicited;Palpable increased muscle length Bil    Levator Scapulae Response  Twitch response elicited;Palpable increased muscle length Bil             PT Short Term Goals - 01/29/18 1136      PT SHORT TERM GOAL #1   Title  Independent with initial HEP    Status  Achieved      PT SHORT TERM GOAL #2   Title  B foot and ankle DF & eversion AROM WFL to allow for normal gait mechanics    Status  On-going      PT SHORT TERM GOAL #3   Title  Pt will verbalize/demonstrate understanding of neutral cervical spine and shoulder posture to reduce cervical strain with daily chores and work tasks    Status  On-going        PT Long Term Goals - 01/19/18 0937      PT LONG TERM GOAL #1   Title   Independent with ongoing HEP     Status  On-going      PT LONG TERM GOAL #2   Title  Pt will report reduction in cervical muscle tension by >/= 75%to allow improved tolerance for her work day    Status  On-going      PT LONG TERM GOAL #3   Title  Pt will report >/= 50% decrease in pain in B plantar fascia with rising in the morning and on completion of work shift      Status  On-going      PT LONG TERM GOAL #4   Title  B foot and ankle strength >/= 4+/5 for improved stabililty and work tolerance    Status  On-going      PT LONG TERM GOAL #5   Title  Pt will verbalize understanding of ways to manage her pain, muscle tension and elongation of cervical muscles in case of relapse    Status  On-going            Plan - 02/11/18 0937    Clinical Impression Statement  Melissa Mejia noting benefit from kinesiotaping lasting a few days after last treatment session, but admits to poor compliance with HEP due to 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 Pt feels like pain is somewhat worse in both neck/upper shoulder area as well as B feet with the increased activity at both workplaces. Increased tension noted in B UT & LS as well as B plantar fascia which was addressed with manual therapy including DN - positive response noted with palpable decreased in muscle tension by end of session. Kinesiotape reapplied to B plantar fascia at pt's request as she hopes if will improve her standing and walking tolerance for her upcoming weekend ED shift.    Rehab Potential  Good    Clinical Impairments Affecting Rehab Potential  surgery for hernia repair on 01/13/18    PT Treatment/Interventions  Patient/family education;ADLs/Self Care Home Management;Therapeutic exercise;Therapeutic activities;Balance training;Manual techniques;Dry needling;Taping;Electrical Stimulation;Moist Heat;Traction;Ultrasound;Iontophoresis 88m/ml Dexamethasone;Cryotherapy;Vasopneumatic Device     Consulted and Agree with Plan of Care  Patient       Patient will benefit from skilled therapeutic intervention in order to improve the following  deficits and impairments:  Pain, Increased muscle spasms, Postural dysfunction, Impaired flexibility, Decreased strength, Difficulty walking, Decreased activity tolerance  Visit Diagnosis: Cervicalgia  Pain in left foot  Pain in right foot  Cramp and spasm  Muscle weakness (generalized)  Difficulty in walking, not elsewhere classified     Problem List Patient Active Problem List   Diagnosis Date Noted  . Trapezius muscle strain 12/23/2016  . Left shoulder pain 12/23/2016  . ADHD 11/14/2016  . GERD (gastroesophageal reflux disease) 11/14/2016  . S/P ACL reconstruction 02/09/2014  . Left knee injury 01/03/2014    Percival Spanish, PT, MPT 02/11/2018, 1:48 PM  [facility] [address]  [place], [place], [address] Phone: [phone]   Fax:  [phone]  Name: Melissa Mejia MRN: [id number] Date of Birth: Jul 17, 1966  PHYSICAL THERAPY DISCHARGE SUMMARY  Visits from Start of Care: 6  Current functional level related to goals / functional outcomes:   Unable to formally assess as pt failed to return for remaining visits in POC, but refer to above clinical impression for status as of last visit on 02/11/18.   Remaining deficits:   As above.   Education / Equipment:   HEP  Plan: Patient agrees to discharge.  Patient goals were partially met. Patient is being discharged due to not returning since the last visit.  ?????     Percival Spanish, PT, MPT 03/30/18, 5:30 PM  [facility] [address]  [place], [place], [address] Phone: [phone]   Fax:  [phone]

## 2018-02-12 ENCOUNTER — Ambulatory Visit: Payer: No Typology Code available for payment source | Admitting: Physical Therapy

## 2018-02-23 MED FILL — DEXTROAMP-AMPHETAMIN 20 MG: 20 | 30 days supply | Qty: 30 | Fill #0

## 2018-02-23 MED FILL — ADDERALL XR 20 MG CAP SA: 20 | 30 days supply | Qty: 30 | Fill #0

## 2018-05-25 MED FILL — ADDERALL XR 20 MG CAP SA: 20 | 30 days supply | Qty: 30 | Fill #0

## 2018-05-25 MED FILL — DEXTROAMP-AMPHETAMIN 20 MG: 20 | 30 days supply | Qty: 30 | Fill #0

## 2018-07-14 MED FILL — ADDERALL XR 20 MG CAP SA: 20 | 30 days supply | Qty: 30 | Fill #0

## 2018-07-14 MED FILL — DEXTROAMP-AMPHETAMIN 20 MG: 20 | 30 days supply | Qty: 30 | Fill #0

## 2018-08-28 ENCOUNTER — Encounter: Payer: Self-pay | Admitting: Family

## 2018-08-30 NOTE — Telephone Encounter (Signed)
I spoke to patient and advised her to contact her insurance to find out who is in network for her and then let me know. She is agreeable.

## 2018-09-16 ENCOUNTER — Ambulatory Visit: Payer: No Typology Code available for payment source | Admitting: Physician Assistant

## 2018-09-16 ENCOUNTER — Encounter: Payer: Self-pay | Admitting: Physician Assistant

## 2018-09-16 VITALS — BP 134/89

## 2018-09-16 DIAGNOSIS — F902 Attention-deficit hyperactivity disorder, combined type: Secondary | ICD-10-CM

## 2018-09-16 MED ORDER — AMPHETAMINE-DEXTROAMPHET ER 20 MG PO CP24: 20.0000 mg | ORAL_CAPSULE | Freq: Every day | ORAL | 0 refills | Status: DC

## 2018-09-16 MED ORDER — AMPHETAMINE-DEXTROAMPHETAMINE 20 MG PO TABS: 20.0000 mg | ORAL_TABLET | Freq: Every day | ORAL | 0 refills | Status: DC

## 2018-09-16 MED FILL — ADDERALL XR 20 MG CAP SA: 20 | 30 days supply | Qty: 30 | Fill #0

## 2018-09-16 MED FILL — AMPHET-DEXTROAMP 20 MG TAB: 20 | 30 days supply | Qty: 30 | Fill #0

## 2018-09-16 NOTE — Progress Notes (Signed)
[facility] Med Check  Patient ID: Melissa Mejia,  MRN: [id number]  PCP: Debbrah Alar, NP  Date of Evaluation: 09/16/2018 Time spent:15 minutes  Chief Complaint:  Chief Complaint    Follow-up      HISTORY/CURRENT STATUS: HPI here for 38-month med check for ADHD.  States that attention is good without easy distractibility.  Able to focus on things and finish tasks to completion.  States the Adderall works very well.  In fact she only takes it a couple of days a week.  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  A 65-month supply has lasted her 6 months.  Individual Medical History/ Review of Systems: Changes? :No    Past medications for mental health diagnoses include: None  Allergies: Patient has no known allergies.  Current Medications:  Current Outpatient Medications:  .  amphetamine-dextroamphetamine (ADDERALL) 20 MG tablet, Take 20 mg by mouth daily as needed., Disp: , Rfl:  .  amphetamine-dextroamphetamine (ADDERALL XR) 15 MG 24 hr capsule, Take 15 mg by mouth every morning., Disp: , Rfl:  .  amphetamine-dextroamphetamine (ADDERALL XR) 20 MG 24 hr capsule, Take 1 capsule (20 mg total) by mouth daily., Disp: 30 capsule, Rfl: 0 .  [START ON 10/16/2018] amphetamine-dextroamphetamine (ADDERALL XR) 20 MG 24 hr capsule, Take 1 capsule (20 mg total) by mouth daily., Disp: 30 capsule, Rfl: 0 .  [START ON 11/13/2018] amphetamine-dextroamphetamine (ADDERALL XR) 20 MG 24 hr capsule, Take 1 capsule (20 mg total) by mouth daily., Disp: 30 capsule, Rfl: 0 .  amphetamine-dextroamphetamine (ADDERALL) 20 MG tablet, Take 1 tablet (20 mg total) by mouth daily at 12 noon., Disp: 30 tablet, Rfl: 0 .  [START ON 10/16/2018] amphetamine-dextroamphetamine (ADDERALL) 20 MG tablet, Take 1 tablet (20 mg total) by mouth daily at 12 noon., Disp: 30 tablet, Rfl: 0 .  [START ON 11/13/2018]  amphetamine-dextroamphetamine (ADDERALL) 20 MG tablet, Take 1 tablet (20 mg total) by mouth daily at 12 noon., Disp: 30 tablet, Rfl: 0 .  ASHWAGANDHA PO, Take by mouth., Disp: , Rfl:  .  ondansetron (ZOFRAN ODT) 4 MG disintegrating tablet, Take 1 tablet (4 mg total) by mouth every 8 (eight) hours as needed for nausea or vomiting. (Patient not taking: Reported on 09/16/2018), Disp: 20 tablet, Rfl: 0 .  oxyCODONE (OXY IR/ROXICODONE) 5 MG immediate release tablet, Take 1 tablet (5 mg total) by mouth every 6 (six) hours as needed for severe pain. (Patient not taking: Reported on 09/16/2018), Disp: 20 tablet, Rfl: 0 Medication Side Effects: none  Family Medical/ Social History: Changes? No  MENTAL HEALTH EXAM:  Blood pressure 134/89.There is no height or weight on file to calculate BMI.  General Appearance: Casual and Well Groomed  Eye Contact:  Good  Speech:  Clear and Coherent  Volume:  Normal  Mood:  Euthymic  Affect:  Appropriate  Thought Process:  Goal Directed  Orientation:  Full (Time, Place, and Person)  Thought Content: Logical   Suicidal Thoughts:  No  Homicidal Thoughts:  No  Memory:  WNL  Judgement:  Good  Insight:  Good  Psychomotor Activity:  Normal  Concentration:  Concentration: Good and Attention Span: Good  Recall:  Good  Fund of Knowledge: Good  Language: Good  Assets:  Desire for Improvement  ADL's:  Intact  Cognition: WNL  Prognosis:  Good    DIAGNOSES:    ICD-10-CM   1. Attention deficit hyperactivity  disorder (ADHD), combined type F90.2     Receiving Psychotherapy: No    RECOMMENDATIONS: Continue Adderall XR 20 mg every morning as needed. Continue Adderall IR 20 mg at noon as needed Return in 6 months  Donnal Moat, PA-C

## 2018-10-25 ENCOUNTER — Encounter: Payer: Self-pay | Admitting: Family

## 2018-10-25 DIAGNOSIS — Z0100 Encounter for examination of eyes and vision without abnormal findings: Secondary | ICD-10-CM

## 2018-11-01 MED FILL — ADDERALL XR 20 MG CAP SA: 20 | 30 days supply | Qty: 30 | Fill #0

## 2018-11-01 MED FILL — AMPHETAMINE-DEXTROAMPHETAMI: 20 | 30 days supply | Qty: 30 | Fill #0

## 2019-01-26 MED FILL — ADDERALL XR 20 MG CAP SA: 20 | 30 days supply | Qty: 30 | Fill #0

## 2019-01-26 MED FILL — AMPHETAMINE-DEXTROAMPHETAMI: 20 | 30 days supply | Qty: 30 | Fill #0

## 2019-03-17 ENCOUNTER — Encounter: Payer: Self-pay | Admitting: Physician Assistant

## 2019-03-17 ENCOUNTER — Ambulatory Visit (INDEPENDENT_AMBULATORY_CARE_PROVIDER_SITE_OTHER): Payer: No Typology Code available for payment source | Admitting: Physician Assistant

## 2019-03-17 ENCOUNTER — Other Ambulatory Visit: Payer: Self-pay

## 2019-03-17 DIAGNOSIS — F902 Attention-deficit hyperactivity disorder, combined type: Secondary | ICD-10-CM

## 2019-03-17 MED ORDER — AMPHETAMINE-DEXTROAMPHET ER 20 MG PO CP24: 20.0000 mg | ORAL_CAPSULE | Freq: Every day | ORAL | 0 refills | Status: DC

## 2019-03-17 MED ORDER — AMPHETAMINE-DEXTROAMPHETAMINE 20 MG PO TABS: 20.0000 mg | ORAL_TABLET | Freq: Every day | ORAL | 0 refills | Status: DC

## 2019-03-17 MED FILL — ADDERALL XR 20 MG CAP SA: 20 | 30 days supply | Qty: 30 | Fill #0

## 2019-03-17 MED FILL — AMPHETAMINE-DEXTROAMPHETAMI: 20 | 30 days supply | Qty: 30 | Fill #0

## 2019-03-17 NOTE — Progress Notes (Signed)
[facility] Check  Patient ID: Melissa Mejia,  MRN: [id number]  PCP: Debbrah Alar, NP  Date of Evaluation: 03/17/2019 Time spent:15 minutes  Chief Complaint:  Chief Complaint    Follow-up     Virtual Visit via Telephone Note  I connected with patient by a video enabled telemedicine application or telephone, with their informed consent, and verified patient privacy and that I am speaking with the correct person using two identifiers.  I am private, in my home and the patient is home.  I discussed the limitations, risks, security and privacy concerns of performing an evaluation and management service by telephone and the availability of in person appointments. I also discussed with the patient that there may be a patient responsible charge related to this service. The patient expressed understanding and agreed to proceed.   I discussed the assessment and treatment plan with the patient. The patient was provided an opportunity to ask questions and all were answered. The patient agreed with the plan and demonstrated an understanding of the instructions.   The patient was advised to call back or seek an in-person evaluation if the symptoms worsen or if the condition fails to improve as anticipated.  I provided 15 minutes of non-face-to-face time during this encounter.  HISTORY/CURRENT STATUS: HPI For 40-month med check.  Patient states that she is doing very well on the current doses of Adderall.  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  And the short acting is as needed other times.  The 3 prescriptions I give her for 90 days last the full 6 months.  She is able to focus and get things accomplished that needs to be done.  She denies easy distractibility.  Denies palpitations or elevated BP.  No increased anxiety.  Denies dizziness, syncope, seizures, numbness, tingling, tremor, tics, unsteady gait, slurred speech, confusion. Denies  muscle or joint pain, stiffness, or dystonia.  Individual Medical History/ Review of Systems: Changes? :No   Allergies: Patient has no known allergies.  Current Medications:  Current Outpatient Medications:  .  amphetamine-dextroamphetamine (ADDERALL XR) 20 MG 24 hr capsule, Take 1 capsule (20 mg total) by mouth daily., Disp: 30 capsule, Rfl: 0 .  amphetamine-dextroamphetamine (ADDERALL XR) 20 MG 24 hr capsule, Take 1 capsule (20 mg total) by mouth daily., Disp: 30 capsule, Rfl: 0 .  amphetamine-dextroamphetamine (ADDERALL XR) 20 MG 24 hr capsule, Take 1 capsule (20 mg total) by mouth daily., Disp: 30 capsule, Rfl: 0 .  amphetamine-dextroamphetamine (ADDERALL) 20 MG tablet, Take 20 mg by mouth daily as needed., Disp: , Rfl:  .  amphetamine-dextroamphetamine (ADDERALL) 20 MG tablet, Take 1 tablet (20 mg total) by mouth daily at 12 noon., Disp: 30 tablet, Rfl: 0 .  amphetamine-dextroamphetamine (ADDERALL) 20 MG tablet, Take 1 tablet (20 mg total) by mouth daily at 12 noon., Disp: 30 tablet, Rfl: 0 .  amphetamine-dextroamphetamine (ADDERALL) 20 MG tablet, Take 1 tablet (20 mg total) by mouth daily at 12 noon., Disp: 30 tablet, Rfl: 0 .  cholecalciferol (VITAMIN D3) 25 MCG (1000 UT) tablet, Take 1,000 Units by mouth daily., Disp: , Rfl:  .  Multiple Vitamin (MULTIVITAMIN) tablet, Take 1 tablet by mouth daily., Disp: , Rfl:  .  amphetamine-dextroamphetamine (ADDERALL XR) 15 MG 24 hr capsule, Take 15 mg by mouth every morning., Disp: , Rfl:  .  ASHWAGANDHA PO, Take by mouth., Disp: , Rfl:  .  ondansetron (ZOFRAN ODT) 4 MG disintegrating tablet, Take  1 tablet (4 mg total) by mouth every 8 (eight) hours as needed for nausea or vomiting. (Patient not taking: Reported on 09/16/2018), Disp: 20 tablet, Rfl: 0 .  oxyCODONE (OXY IR/ROXICODONE) 5 MG immediate release tablet, Take 1 tablet (5 mg total) by mouth every 6 (six) hours as needed for severe pain. (Patient not taking: Reported on 09/16/2018), Disp: 20  tablet, Rfl: 0 Medication Side Effects: none  Family Medical/ Social History: Changes? No  MENTAL HEALTH EXAM:  There were no vitals taken for this visit.There is no height or weight on file to calculate BMI.  General Appearance: unable to assess  Eye Contact:  unable to assess  Speech:  Clear and Coherent  Volume:  Normal  Mood:  Euthymic  Affect:  unable to assess  Thought Process:  Goal Directed  Orientation:  Full (Time, Place, and Person)  Thought Content: Logical   Suicidal Thoughts:  No  Homicidal Thoughts:  No  Memory:  WNL  Judgement:  Good  Insight:  Good  Psychomotor Activity:  unable to assess  Concentration:  Concentration: Good and Attention Span: Good  Recall:  Good  Fund of Knowledge: Good  Language: Good  Assets:  Desire for Improvement  ADL's:  Intact  Cognition: WNL  Prognosis:  Good    DIAGNOSES:    ICD-10-CM   1. Attention deficit hyperactivity disorder (ADHD), combined type  F90.2     Receiving Psychotherapy: No    RECOMMENDATIONS:  Continue Adderall XR 20 mg every morning as needed. Continue Adderall 20 mg daily around noon as needed. PDMP was reviewed. Return in 6 months.  Donnal Moat, PA-C   This record has been created using [employer].  Chart creation errors have been sought, but may not always have been located and corrected. Such creation errors do not reflect on the standard of medical care.

## 2019-04-20 MED FILL — AMPHETAMINE-DEXTROAMPHETAMI: 20 | 30 days supply | Qty: 30 | Fill #0

## 2019-04-20 MED FILL — ADDERALL XR 20 MG CAP SA: 20 | 30 days supply | Qty: 30 | Fill #0

## 2019-05-04 MED FILL — CLINDAMYCIN HCL 300 MG CAPS: 300 | 7 days supply | Qty: 21 | Fill #0

## 2019-05-24 ENCOUNTER — Other Ambulatory Visit: Payer: Self-pay | Admitting: Physician Assistant

## 2019-05-24 NOTE — Telephone Encounter (Signed)
Due back in Jan 

## 2019-05-25 MED FILL — AMPHETAMINE-DEXTROAMPHETAMI: 20 | 30 days supply | Qty: 30 | Fill #0

## 2019-05-25 MED FILL — ADDERALL XR 20 MG CAP SA: 20 | 30 days supply | Qty: 30 | Fill #0

## 2019-05-26 ENCOUNTER — Encounter: Payer: Self-pay | Admitting: Family

## 2019-05-26 DIAGNOSIS — M549 Dorsalgia, unspecified: Secondary | ICD-10-CM

## 2019-06-15 ENCOUNTER — Telehealth: Payer: No Typology Code available for payment source | Admitting: Nurse Practitioner

## 2019-06-15 ENCOUNTER — Other Ambulatory Visit: Payer: Self-pay

## 2019-06-15 DIAGNOSIS — Z20822 Contact with and (suspected) exposure to covid-19: Secondary | ICD-10-CM

## 2019-06-15 DIAGNOSIS — R509 Fever, unspecified: Secondary | ICD-10-CM

## 2019-06-15 DIAGNOSIS — R519 Headache, unspecified: Secondary | ICD-10-CM

## 2019-06-15 DIAGNOSIS — R52 Pain, unspecified: Secondary | ICD-10-CM

## 2019-06-15 DIAGNOSIS — R059 Cough, unspecified: Secondary | ICD-10-CM

## 2019-06-15 DIAGNOSIS — R05 Cough: Secondary | ICD-10-CM

## 2019-06-15 DIAGNOSIS — Z20828 Contact with and (suspected) exposure to other viral communicable diseases: Secondary | ICD-10-CM

## 2019-06-15 MED ORDER — BENZONATATE 100 MG PO CAPS: 100.0000 mg | ORAL_CAPSULE | Freq: Three times a day (TID) | ORAL | 0 refills | Status: DC | PRN

## 2019-06-15 MED FILL — BENZONATATE 100 MG CAPS: 100 | 6 days supply | Qty: 20 | Fill #0

## 2019-06-15 NOTE — Progress Notes (Signed)
E-Visit for Corona Virus Screening   Your current symptoms could be consistent with the coronavirus.  Many health care providers can now test patients at their office but not all are.  [facility] has multiple testing sites. For information on our COVID testing locations and hours go to [email or web address]  Please quarantine yourself while awaiting your test results.  We are enrolling you in our [place] for [place] . Daily you will receive a questionnaire within the [facility] website. Our COVID 19 response team willl be monitoriing your responses daily.  You can go to one of the  testing sites listed below, while they are opened (see hours). You do not need a doctors order to be tested for covid.You do need to self-isolate until your results return and if positive 14 days from when your symptoms started and until you are 3 days symptom free.   Testing Locations (Monday - Friday, 8 a.m. - 3:30 p.m.)   [place]: [facility] Entrance), [address], [place], [place]: [place] [address], [place], [place], [place] (entrance off [place] (Closed each Monday): [address] [place], [place] - the short stay covered drive at [facility] (Use the [employer] entrance to [facility] next to [place] is a respiratory illness with symptoms that are similar to the flu. Symptoms are typically mild to moderate, but there have been cases of severe illness and death due to the virus. The following symptoms may appear 2-14 days after exposure: . Fever . Cough . Shortness of breath or difficulty breathing . Chills . Repeated shaking with chills . Muscle pain . Headache . Sore throat . New loss of taste or smell . Fatigue . Congestion or runny nose . Nausea or vomiting . Diarrhea  It is vitally  important that if you feel that you have an infection such as this virus or any other virus that you stay home and away from places where you may spread it to others.  You should self-quarantine for 14 days if you have symptoms that could potentially be coronavirus or have been in close contact a with a person diagnosed with COVID-19 within the last 2 weeks. You should avoid contact with people age 25 and older.   You should wear a mask or cloth face covering over your nose and mouth if you must be around other people or animals, including pets (even at home). Try to stay at least 6 feet away from other people. This will protect the people around you.  You can use medication such as A prescription cough medication called Tessalon Perles 100 mg. You may take 1-2 capsules every 8 hours as needed for cough  You may also take acetaminophen (Tylenol) as needed for fever.   Reduce your risk of any infection by using the same precautions used for avoiding the common cold or flu:  Marland Kitchen Wash your hands often with soap and warm water for at least 20 seconds.  If soap and water are not readily available, use an alcohol-based hand sanitizer with at least 60% alcohol.  . If coughing or sneezing, cover your mouth and nose by coughing or sneezing into the elbow areas of your shirt or coat, into a tissue or into your sleeve (not your hands). . Avoid shaking hands with others and consider head nods or  verbal greetings only. . Avoid touching your eyes, nose, or mouth with unwashed hands.  . Avoid close contact with people who are sick. . Avoid places or events with large numbers of people in one location, like concerts or sporting events. . Carefully consider travel plans you have or are making. . If you are planning any travel outside or inside the [place], visit the CDC's Travelers' Health webpage for the latest health notices. . If you have some symptoms but not all symptoms, continue to monitor at home and seek medical  attention if your symptoms worsen. . If you are having a medical emergency, call 911.  HOME CARE . Only take medications as instructed by your medical team. . Drink plenty of fluids and get plenty of rest. . A steam or ultrasonic humidifier can help if you have congestion.   GET HELP RIGHT AWAY IF YOU HAVE EMERGENCY WARNING SIGNS** FOR COVID-19. If you or someone is showing any of these signs seek emergency medical care immediately. Call 911 or proceed to your closest emergency facility if: . You develop worsening high fever. . Trouble breathing . Bluish lips or face . Persistent pain or pressure in the chest . New confusion . Inability to wake or stay awake . You cough up blood. . Your symptoms become more severe  **This list is not all possible symptoms. Contact your medical provider for any symptoms that are sever or concerning to you.   MAKE SURE YOU   Understand these instructions.  Will watch your condition.  Will get help right away if you are not doing well or get worse.  Your e-visit answers were reviewed by a board certified advanced clinical practitioner to complete your personal care plan.  Depending on the condition, your plan could have included both over the counter or prescription medications.  If there is a problem please reply once you have received a response from your provider.  Your safety is important to [place].  If you have drug allergies check your prescription carefully.    You can use MyChart to ask questions about today's visit, request a non-urgent call back, or ask for a work or school excuse for 24 hours related to this e-Visit. If it has been greater than 24 hours you will need to follow up with your provider, or enter a new e-Visit to address those concerns. You will get an e-mail in the next two days asking about your experience.  I hope that your e-visit has been valuable and will speed your recovery. Thank you for using e-visits.   5-10 minutes  spent reviewing and documenting in chart.

## 2019-06-16 LAB — NOVEL CORONAVIRUS, NAA: SARS-CoV-2, NAA: DETECTED — AB

## 2019-07-25 ENCOUNTER — Other Ambulatory Visit: Payer: Self-pay | Admitting: Physician Assistant

## 2019-07-26 MED FILL — ADDERALL XR 20 MG CAP SA: 20 | 30 days supply | Qty: 30 | Fill #0

## 2019-07-26 MED FILL — AMPHETAMINE-DEXTROAMPHETAMI: 20 | 30 days supply | Qty: 30 | Fill #0

## 2019-07-26 NOTE — Telephone Encounter (Signed)
Last refills on both 05/25/2019  Last apt 02/2019 due back 6 months

## 2019-10-19 ENCOUNTER — Other Ambulatory Visit: Payer: Self-pay | Admitting: Physician Assistant

## 2019-10-20 MED FILL — AMPHETAMINE-DEXTROAMPHETAMI: 20 | 30 days supply | Qty: 30 | Fill #0

## 2019-10-20 MED FILL — ADDERALL XR 20 MG CAP SA: 20 | 30 days supply | Qty: 30 | Fill #0

## 2019-10-20 NOTE — Telephone Encounter (Signed)
Last apt 02/2019, due 6 months

## 2019-12-25 ENCOUNTER — Other Ambulatory Visit: Payer: Self-pay

## 2019-12-25 ENCOUNTER — Encounter (HOSPITAL_BASED_OUTPATIENT_CLINIC_OR_DEPARTMENT_OTHER): Payer: Self-pay | Admitting: Emergency Medicine

## 2019-12-25 ENCOUNTER — Emergency Department (HOSPITAL_BASED_OUTPATIENT_CLINIC_OR_DEPARTMENT_OTHER)
Admission: EM | Admit: 2019-12-25 | Discharge: 2019-12-25 | Disposition: A | Discharge status: Home / Self Care | Payer: No Typology Code available for payment source | Attending: Emergency Medicine | Admitting: Emergency Medicine

## 2019-12-25 DIAGNOSIS — Z87891 Personal history of nicotine dependence: Secondary | ICD-10-CM | POA: Diagnosis not present

## 2019-12-25 DIAGNOSIS — S51812A Laceration without foreign body of left forearm, initial encounter: Secondary | ICD-10-CM | POA: Insufficient documentation

## 2019-12-25 DIAGNOSIS — Y999 Unspecified external cause status: Secondary | ICD-10-CM | POA: Diagnosis not present

## 2019-12-25 DIAGNOSIS — W108XXA Fall (on) (from) other stairs and steps, initial encounter: Secondary | ICD-10-CM | POA: Diagnosis not present

## 2019-12-25 DIAGNOSIS — Y92007 Garden or yard of unspecified non-institutional (private) residence as the place of occurrence of the external cause: Secondary | ICD-10-CM | POA: Diagnosis not present

## 2019-12-25 DIAGNOSIS — Z79899 Other long term (current) drug therapy: Secondary | ICD-10-CM | POA: Diagnosis not present

## 2019-12-25 DIAGNOSIS — Y9301 Activity, walking, marching and hiking: Secondary | ICD-10-CM | POA: Diagnosis not present

## 2019-12-25 DIAGNOSIS — Z23 Encounter for immunization: Secondary | ICD-10-CM | POA: Diagnosis not present

## 2019-12-25 DIAGNOSIS — W19XXXA Unspecified fall, initial encounter: Secondary | ICD-10-CM

## 2019-12-25 DIAGNOSIS — S300XXA Contusion of lower back and pelvis, initial encounter: Secondary | ICD-10-CM

## 2019-12-25 MED ORDER — TETANUS-DIPHTH-ACELL PERTUSSIS 5-2.5-18.5 LF-MCG/0.5 IM SUSP
INTRAMUSCULAR | Status: AC
  Filled 2019-12-25: qty 0.5

## 2019-12-25 MED ORDER — TETANUS-DIPHTH-ACELL PERTUSSIS 5-2.5-18.5 LF-MCG/0.5 IM SUSP
0.5000 mL | Freq: Once | INTRAMUSCULAR | Status: AC
  Administered 2019-12-25: 14:00:00 0.5 mL via INTRAMUSCULAR

## 2019-12-25 NOTE — ED Triage Notes (Signed)
Pt slipped and fell down the stairs on her deck. C/o back pain and has lac to L arm.

## 2019-12-25 NOTE — ED Provider Notes (Signed)
[facility] EMERGENCY DEPARTMENT Provider Note  CSN: [id number] Arrival date & time: 12/25/19 1305    History Chief Complaint  Patient presents with  . Fall    HPI  Melissa Mejia is a 54 y.o. female who reports she slipped and fell while walking down her deck stairs earlier today landing on her back and sustaining a laceration to her L forearm. She did not hit her head or lose consciousness. She reports she washed the wound with tap water and soap prior to arrival. Complaining of moderate aching pain to L lower back and L forearm. TDaP ~5 years ago, would like update today.    Past Medical History:  Diagnosis Date  . ADD (attention deficit disorder)   . History of esophageal reflux   . Right ACL tear   . Wears contact lenses     Past Surgical History:  Procedure Laterality Date  . EPIGASTRIC HERNIA REPAIR N/A 01/13/2018   Procedure: PRIMARY REPAIR OF EPIGASTRIC VENTRAL HERNIA, ERAS PATHWAY;  Surgeon: Donnie Mesa, MD;  Location: [facility];  Service: General;  Laterality: N/A;  . KNEE ARTHROSCOPY WITH ANTERIOR CRUCIATE LIGAMENT (ACL) REPAIR WITH HAMSTRING GRAFT Left 02/09/2014   Procedure: LEFT KNEE ARTHROSCOPY WITH ALLOGRAFT ANTERIOR CRUCIATE LIGAMENT (ACL) HAMSTRING RECONSTRUCTION ;  Surgeon: Sydnee Cabal, MD;  Location: [facility];  Service: Orthopedics;  Laterality: Left;  . SHOULDER ARTHROSCOPY W/ LABRAL REPAIR Left 06/08/2007  . SHOULDER SURGERY Left 2008   labrum repair  . WISDOM TOOTH EXTRACTION      Family History  Problem Relation Age of Onset  . Cancer Father        throat cancer    Social History   Tobacco Use  . Smoking status: Former Smoker    Years: 4.00    Types: Cigarettes    Quit date: 02/09/1993    Years since quitting: 26.8  . Smokeless tobacco: Never Used  . Tobacco comment: hx  social some day smoker  Substance Use Topics  . Alcohol use: Yes    Alcohol/week: 0.0 - 1.0 standard drinks    Comment:  per month  . Drug use: No     Home Medications Prior to Admission medications   Medication Sig Start Date End Date Taking? Authorizing Provider  ADDERALL XR 20 MG 24 hr capsule TAKE 1 CAPSULE (20 MG TOTAL) BY MOUTH DAILY. 10/20/19   Addison Lank, PA-C  amphetamine-dextroamphetamine (ADDERALL XR) 20 MG 24 hr capsule Take 1 capsule (20 mg total) by mouth daily. 10/16/18   Addison Lank, PA-C  amphetamine-dextroamphetamine (ADDERALL XR) 20 MG 24 hr capsule Take 1 capsule (20 mg total) by mouth daily. 11/13/18   Donnal Moat T, PA-C  amphetamine-dextroamphetamine (ADDERALL) 20 MG tablet Take 20 mg by mouth daily as needed.    [provider]  amphetamine-dextroamphetamine (ADDERALL) 20 MG tablet Take 1 tablet (20 mg total) by mouth daily at 12 noon. 10/16/18   Donnal Moat T, PA-C  amphetamine-dextroamphetamine (ADDERALL) 20 MG tablet Take 1 tablet (20 mg total) by mouth daily at 12 noon. 11/13/18   Donnal Moat T, PA-C  amphetamine-dextroamphetamine (ADDERALL) 20 MG tablet TAKE 1 TABLET (20 MG TOTAL) BY MOUTH DAILY AT 12 NOON. 05/25/19   Donnal Moat T, PA-C  amphetamine-dextroamphetamine (ADDERALL) 20 MG tablet TAKE 1 TABLET (20 MG TOTAL) BY MOUTH DAILY AT 12 NOON. 10/20/19   Hurst, Helene Kelp T, PA-C  ASHWAGANDHA PO Take by mouth.    [provider]  benzonatate (TESSALON PERLES) 100 MG capsule Take 1 capsule (100 mg total) by mouth 3 (three) times daily as needed. 06/15/19   Hassell Done Mary-Margaret, FNP  cholecalciferol (VITAMIN D3) 25 MCG (1000 UT) tablet Take 1,000 Units by mouth daily.    [provider]  Multiple Vitamin (MULTIVITAMIN) tablet Take 1 tablet by mouth daily.    [provider]  ondansetron (ZOFRAN ODT) 4 MG disintegrating tablet Take 1 tablet (4 mg total) by mouth every 8 (eight) hours as needed for nausea or vomiting. Patient not taking: Reported on 09/16/2018 01/13/18   Donnie Mesa, MD  oxyCODONE (OXY IR/ROXICODONE) 5 MG immediate release  tablet Take 1 tablet (5 mg total) by mouth every 6 (six) hours as needed for severe pain. Patient not taking: Reported on 09/16/2018 01/13/18   Donnie Mesa, MD     Allergies    Patient has no known allergies.   Review of Systems   Review of Systems A comprehensive review of systems was completed and negative except as noted in HPI.    Physical Exam BP (!) 142/101 (BP Location: Right Arm)   Pulse 82   Temp 98.3 F (36.8 C) (Oral)   Resp 18   SpO2 100%   Physical Exam Vitals and nursing note reviewed.  HENT:     Head: Normocephalic.     Nose: Nose normal.  Eyes:     Extraocular Movements: Extraocular movements intact.  Pulmonary:     Effort: Pulmonary effort is normal.  Musculoskeletal:     Cervical back: Neck supple.     Comments: Contusion to L lower back, mild tenderness to palpation, no bony tenderness  Skin:    Findings: No rash (on exposed skin).     Comments: 8cm V-shaped laceration to L posterior forearm, into the subcutaneous fat, but not into muscle or vascular structures.   Neurological:     Mental Status: She is alert and oriented to person, place, and time.  Psychiatric:        Mood and Affect: Mood normal.      ED Results / Procedures / Treatments   Labs (all labs ordered are listed, but only abnormal results are displayed) Labs Reviewed - No data to display  EKG None   Radiology No results found.  Procedures .Marland KitchenLaceration Repair  Date/Time: 12/25/2019 1:54 PM Performed by: Truddie Hidden, MD Authorized by: Truddie Hidden, MD   Consent:    Consent obtained:  Verbal   Consent given by:  Patient Anesthesia (see MAR for exact dosages):    Anesthesia method:  Local infiltration   Local anesthetic:  Lidocaine 1% WITH epi Laceration details:    Location:  Shoulder/arm   Shoulder/arm location:  L lower arm   Length (cm):  8 Repair type:    Repair type:  Simple Pre-procedure details:    Preparation:  Patient was prepped and draped  in usual sterile fashion Exploration:    Hemostasis achieved with:  Epinephrine   Wound exploration: wound explored through full range of motion     Wound extent: no muscle damage noted and no vascular damage noted     Contaminated: no   Treatment:    Area cleansed with:  Betadine   Amount of cleaning:  Standard   Irrigation solution:  Sterile saline   Irrigation method:  Syringe   Visualized foreign bodies/material removed: no   Skin repair:    Repair method:  Sutures   Suture size:  4-0   Suture material:  Nylon   Suture technique:  Simple interrupted   Number of sutures:  8 Approximation:    Approximation:  Close Post-procedure details:    Dressing:  Non-adherent dressing   Patient tolerance of procedure:  Tolerated well, no immediate complications    Medications Ordered in the ED Medications  Tdap (BOOSTRIX) injection 0.5 mL (has no administration in time range)  Tdap (BOOSTRIX) 5-2.5-18.5 LF-MCG/0.5 injection (has no administration in time range)     ED Course  I have reviewed the triage vital signs and the nursing notes.  Pertinent labs & imaging results that were available during my care of the patient were reviewed by me and considered in my medical decision making (see chart for details).  Clinical Course as of Dec 24 1357  Sun Dec 25, 2019  1356 Patient with mechanical fall, contusion to low back, but not in need of imaging. Laceration of arm repaired. Wound care instructions given. Suture removal in 7-10 days. Return for signs of infection.    [CS]    Clinical Course User Index [CS] Truddie Hidden, MD    MDM Rules/Calculators/A&P MDM  Final Clinical Impression(s) / ED Diagnoses Final diagnoses:  Fall, initial encounter  Laceration of left forearm, initial encounter  Contusion of lower back, initial encounter    Rx / DC Orders ED Discharge Orders    None       Truddie Hidden, MD 12/25/19 1359

## 2020-01-02 ENCOUNTER — Other Ambulatory Visit: Payer: Self-pay | Admitting: Family

## 2020-01-02 ENCOUNTER — Encounter: Payer: Self-pay | Admitting: Family

## 2020-01-02 MED ORDER — METHOCARBAMOL 500 MG PO TABS: 500.0000 mg | ORAL_TABLET | Freq: Three times a day (TID) | ORAL | 0 refills | Status: DC | PRN

## 2020-01-02 MED FILL — METHOCARBAMOL 500 MG TABS: 500 | 7 days supply | Qty: 20 | Fill #0

## 2020-01-02 NOTE — Progress Notes (Signed)
r 

## 2020-02-16 ENCOUNTER — Other Ambulatory Visit: Payer: Self-pay | Admitting: Physician Assistant

## 2020-02-16 NOTE — Telephone Encounter (Signed)
Last visit I see for her is 02/2019

## 2020-02-21 ENCOUNTER — Ambulatory Visit (INDEPENDENT_AMBULATORY_CARE_PROVIDER_SITE_OTHER): Payer: No Typology Code available for payment source | Admitting: Physician Assistant

## 2020-02-21 ENCOUNTER — Encounter: Payer: Self-pay | Admitting: Physician Assistant

## 2020-02-21 ENCOUNTER — Other Ambulatory Visit: Payer: Self-pay

## 2020-02-21 VITALS — BP 135/102 | HR 82

## 2020-02-21 DIAGNOSIS — F902 Attention-deficit hyperactivity disorder, combined type: Secondary | ICD-10-CM | POA: Diagnosis not present

## 2020-02-21 MED ORDER — AMPHETAMINE-DEXTROAMPHET ER 20 MG PO CP24: 20.0000 mg | ORAL_CAPSULE | Freq: Every morning | ORAL | 0 refills | Status: DC

## 2020-02-21 MED ORDER — AMPHETAMINE-DEXTROAMPHETAMINE 20 MG PO TABS: 20.0000 mg | ORAL_TABLET | Freq: Every day | ORAL | 0 refills | Status: DC | PRN

## 2020-02-21 MED FILL — ADDERALL XR 20 MG CAP SA: 20 | 30 days supply | Qty: 30 | Fill #0

## 2020-02-21 MED FILL — AMPHETAMINE-DEXTROAMPHETAMI: 20 | 30 days supply | Qty: 30 | Fill #0

## 2020-02-21 NOTE — Progress Notes (Signed)
[facility] Med Check  Patient ID: Melissa Mejia,  MRN: [id number]  PCP: Debbrah Alar, NP  Date of Evaluation: 02/21/2020 Time spent:20 minutes  Chief Complaint:  Chief Complaint    Medication Refill      HISTORY/CURRENT STATUS: HPI For 54-month med check.  Is doing well. Feels that the meds are working well. She doesn't take the Adderall qd. Only as needed. It is still effective.   Work is going well.  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  She also continues to help out at her [employer], and enjoys that.  Patient denies loss of interest in usual activities and is able to enjoy things.  Denies decreased energy or motivation.  Appetite has not changed.  No extreme sadness, tearfulness, or feelings of hopelessness.  Denies any changes in concentration, making decisions or remembering things.  Denies suicidal or homicidal thoughts.  Denies dizziness, syncope, seizures, numbness, tingling, tremor, tics, unsteady gait, slurred speech, confusion. Denies muscle or joint pain, stiffness, or dystonia.  Individual Medical History/ Review of Systems: Changes? 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  Had sutures.  Also had muscle spasms in her back.  She is much better now.  Past medications for mental health diagnoses include: None  Allergies: Patient has no known allergies.  Current Medications:  Current Outpatient Medications:  .  amphetamine-dextroamphetamine (ADDERALL XR) 20 MG 24 hr capsule, Take 1 capsule (20 mg total) by mouth in the morning., Disp: 30 capsule, Rfl: 0 .  amphetamine-dextroamphetamine (ADDERALL) 20 MG tablet, Take 1 tablet (20 mg total) by mouth daily as needed., Disp: 30 tablet, Rfl: 0 .  Cyanocobalamin (VITAMIN B 12 PO), Take by mouth., Disp: , Rfl:  .  methocarbamol (ROBAXIN) 500 MG tablet, Take 1 tablet (500 mg total) by mouth every 8 (eight) hours as needed for muscle spasms., Disp: 20 tablet, Rfl: 0 .  Multiple  Vitamin (MULTIVITAMIN) tablet, Take 1 tablet by mouth daily., Disp: , Rfl:  Medication Side Effects: none  Family Medical/ Social History: Changes? No  MENTAL HEALTH EXAM:  Blood pressure (!) 135/102, pulse 82.There is no height or weight on file to calculate BMI.  Repeat BP was 135/92 with a pulse of 85.  General Appearance: Casual, Neat and Well Groomed  Eye Contact:  Good  Speech:  Clear and Coherent and Normal Rate  Volume:  Normal  Mood:  Euthymic  Affect:  Appropriate  Thought Process:  Goal Directed  Orientation:  Full (Time, Place, and Person)  Thought Content: Logical   Suicidal Thoughts:  No  Homicidal Thoughts:  No  Memory:  WNL  Judgement:  Good  Insight:  Good  Psychomotor Activity:  Normal  Concentration:  Concentration: Good and Attention Span: Good  Recall:  Good  Fund of Knowledge: Good  Language: Good  Assets:  Desire for Improvement  ADL's:  Intact  Cognition: WNL  Prognosis:  Good    DIAGNOSES:    ICD-10-CM   1. Attention deficit hyperactivity disorder (ADHD), combined type  F90.2     Receiving Psychotherapy: No    RECOMMENDATIONS:  PDMP was reviewed. I provided 20 minutes of face-to-face time during this encounter. She should follow blood pressures closely. Continue Adderall XR 20 mg every morning as needed. Continue Adderall 20 mg daily around noon as needed. Return in 6 months.  Donnal Moat, PA-C

## 2020-03-13 ENCOUNTER — Encounter: Payer: Self-pay | Admitting: Family

## 2020-03-13 ENCOUNTER — Ambulatory Visit (INDEPENDENT_AMBULATORY_CARE_PROVIDER_SITE_OTHER): Payer: No Typology Code available for payment source | Admitting: Family

## 2020-03-13 ENCOUNTER — Telehealth: Payer: Self-pay | Admitting: Family

## 2020-03-13 ENCOUNTER — Other Ambulatory Visit: Payer: Self-pay

## 2020-03-13 ENCOUNTER — Other Ambulatory Visit (HOSPITAL_COMMUNITY)
Admission: RE | Admit: 2020-03-13 | Discharge: 2020-03-13 | Disposition: A | Discharge status: Home / Self Care | Payer: No Typology Code available for payment source | Source: Ambulatory Visit | Attending: Family | Admitting: Family

## 2020-03-13 VITALS — BP 119/89 | HR 71 | Temp 98.4°F | Resp 16 | Wt 183.0 lb

## 2020-03-13 DIAGNOSIS — L989 Disorder of the skin and subcutaneous tissue, unspecified: Secondary | ICD-10-CM

## 2020-03-13 DIAGNOSIS — Z0000 Encounter for general adult medical examination without abnormal findings: Secondary | ICD-10-CM

## 2020-03-13 DIAGNOSIS — Z01419 Encounter for gynecological examination (general) (routine) without abnormal findings: Secondary | ICD-10-CM | POA: Insufficient documentation

## 2020-03-13 DIAGNOSIS — Z0001 Encounter for general adult medical examination with abnormal findings: Secondary | ICD-10-CM

## 2020-03-13 DIAGNOSIS — Z1159 Encounter for screening for other viral diseases: Secondary | ICD-10-CM

## 2020-03-13 DIAGNOSIS — M722 Plantar fascial fibromatosis: Secondary | ICD-10-CM

## 2020-03-13 DIAGNOSIS — Z114 Encounter for screening for human immunodeficiency virus [HIV]: Secondary | ICD-10-CM

## 2020-03-13 LAB — BASIC METABOLIC PANEL
BUN: 19 mg/dL (ref 6–23)
CO2: 30 mEq/L (ref 19–32)
Calcium: 9 mg/dL (ref 8.4–10.5)
Chloride: 105 mEq/L (ref 96–112)
Creatinine, Ser: 0.87 mg/dL (ref 0.40–1.20)
GFR: 68.12 mL/min (ref >60.00)
Glucose, Bld: 97 mg/dL (ref 70–99)
Potassium: 4.3 mEq/L (ref 3.5–5.1)
Sodium: 138 mEq/L (ref 135–145)

## 2020-03-13 LAB — CBC WITH DIFFERENTIAL/PLATELET
Basophils Absolute: 0.1 10*3/uL (ref 0.0–0.1)
Basophils Relative: 1 % (ref 0.0–3.0)
Eosinophils Absolute: 0.2 10*3/uL (ref 0.0–0.7)
Eosinophils Relative: 3 % (ref 0.0–5.0)
HCT: 39.6 % (ref 36.0–46.0)
Hemoglobin: 13.7 g/dL (ref 12.0–15.0)
Lymphocytes Relative: 25.8 % (ref 12.0–46.0)
Lymphs Abs: 1.4 10*3/uL (ref 0.7–4.0)
MCHC: 34.5 g/dL (ref 30.0–36.0)
MCV: 89.7 fl (ref 78.0–100.0)
Monocytes Absolute: 0.4 10*3/uL (ref 0.1–1.0)
Monocytes Relative: 8 % (ref 3.0–12.0)
Neutro Abs: 3.4 10*3/uL (ref 1.4–7.7)
Neutrophils Relative %: 62.2 % (ref 43.0–77.0)
Platelets: 252 10*3/uL (ref 150.0–400.0)
RBC: 4.41 Mil/uL (ref 3.87–5.11)
RDW: 12.5 % (ref 11.5–15.5)
WBC: 5.5 10*3/uL (ref 4.0–10.5)

## 2020-03-13 LAB — LIPID PANEL
Cholesterol: 185 mg/dL (ref 0–200)
HDL: 69.9 mg/dL (ref >39.00)
LDL Cholesterol: 107 mg/dL — ABNORMAL HIGH (ref 0–99)
NonHDL: 115.58
Total CHOL/HDL Ratio: 3
Triglycerides: 41 mg/dL (ref 0.0–149.0)
VLDL: 8.2 mg/dL (ref 0.0–40.0)

## 2020-03-13 LAB — HEPATIC FUNCTION PANEL
ALT: 17 U/L (ref 0–35)
AST: 16 U/L (ref 0–37)
Albumin: 4 g/dL (ref 3.5–5.2)
Alkaline Phosphatase: 73 U/L (ref 39–117)
Bilirubin, Direct: 0.1 mg/dL (ref 0.0–0.3)
Total Bilirubin: 0.5 mg/dL (ref 0.2–1.2)
Total Protein: 6.6 g/dL (ref 6.0–8.3)

## 2020-03-13 LAB — TSH: TSH: 1.58 u[IU]/mL (ref 0.35–4.50)

## 2020-03-13 MED ORDER — MELOXICAM 7.5 MG PO TABS: 7.5000 mg | ORAL_TABLET | Freq: Every day | ORAL | 0 refills | Status: DC

## 2020-03-13 MED FILL — MELOXICAM 7.5 MG TABLET: 7.5 | 14 days supply | Qty: 14 | Fill #0

## 2020-03-13 NOTE — Telephone Encounter (Signed)
cologuard order faxed to [facility] with demographics and insurance card

## 2020-03-13 NOTE — Telephone Encounter (Signed)
Please initiate cologuard. 

## 2020-03-13 NOTE — Progress Notes (Signed)
Subjective:    Patient ID: Melissa Mejia, female    DOB: 03-05-1966, 54 y.o.   MRN: [id number]  HPI  Patient presents today for complete physical.  Immunizations: tdap 2021, had J and J vaccine in April, declines shingrix Diet: fair diet, lost taste of smell since covid Wt Readings from Last 3 Encounters:  03/13/20 183 lb (83 kg)  01/13/18 187 lb 12.8 oz (85.2 kg)  12/28/17 188 lb 6.4 oz (85.5 kg)  Exercise: walks the dog 1-2 miles a day Colonoscopy: declines wants cologuard Pap Smear:  Due  Mammogram: due Dental: up to date Vision: up to date  Skin lesion right forearm- reports that it peels at times.   Bilateral foot pain- on her feet all day in the ER.  Some improvement with arch supports. Icing feet with water bottle and using occasional nsaids.     Review of Systems  Constitutional: Negative for unexpected weight change.  HENT: Negative for hearing loss and rhinorrhea.   Eyes: Negative for visual disturbance.  Respiratory: Negative for cough and shortness of breath.   Cardiovascular: Negative for chest pain.  Gastrointestinal: Negative for constipation and diarrhea.  Genitourinary: Negative for dysuria, frequency and hematuria.  Musculoskeletal: Positive for arthralgias (occasional joint pain).  Skin: Negative for rash.  Neurological: Negative for headaches (only if she has alcohol).  Hematological: Negative for adenopathy.  Psychiatric/Behavioral:       Denies depression/anxiety   Past Medical History:  Diagnosis Date  . ADD (attention deficit disorder)   . History of esophageal reflux   . Right ACL tear   . Wears contact lenses      Social History   Socioeconomic History  . Marital status: Married    Spouse name: Not on file  . Number of children: Not on file  . Years of education: Not on file  . Highest education level: Not on file  Occupational History  . Not on file  Tobacco Use  . Smoking status: Former Smoker    Years: 4.00    Types:  Cigarettes    Quit date: 02/09/1993    Years since quitting: 27.1  . Smokeless tobacco: Never Used  . Tobacco comment: hx  social some day smoker  Vaping Use  . Vaping Use: Never used  Substance and Sexual Activity  . Alcohol use: Yes    Alcohol/week: 0.0 - 1.0 standard drinks    Comment: per month  . Drug use: No  . Sexual activity: Yes    Birth control/protection: Surgical    Comment: husband - vastectomy  Other Topics Concern  . 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    Enjoys cooking, skiing   Social Determinants of Radio broadcast assistant Strain:   . Difficulty of Paying Living Expenses:   Food Insecurity:   . Worried About Charity fundraiser in the Last Year:   . Arboriculturist in the Last Year:   Transportation Needs:   . Film/video editor (Medical):   Marland Kitchen Lack of Transportation (Non-Medical):   Physical Activity:   . Days of Exercise per Week:   . Minutes of Exercise per Session:   Stress:   . Feeling of Stress :   Social Connections:   . Frequency of Communication with Friends and Family:   . Frequency  of Social Gatherings with Friends and Family:   . Attends Religious Services:   . Active Member of Clubs or Organizations:   . Attends Archivist Meetings:   Marland Kitchen Marital Status:   Intimate Partner Violence:   . Fear of Current or Ex-Partner:   . Emotionally Abused:   Marland Kitchen Physically Abused:   . Sexually Abused:     Past Surgical History:  Procedure Laterality Date  . EPIGASTRIC HERNIA REPAIR N/A 01/13/2018   Procedure: PRIMARY REPAIR OF EPIGASTRIC VENTRAL HERNIA, ERAS PATHWAY;  Surgeon: Donnie Mesa, MD;  Location: [facility];  Service: General;  Laterality: N/A;  . KNEE ARTHROSCOPY WITH ANTERIOR CRUCIATE LIGAMENT (ACL) REPAIR WITH HAMSTRING GRAFT Left 02/09/2014   Procedure: LEFT KNEE ARTHROSCOPY WITH  ALLOGRAFT ANTERIOR CRUCIATE LIGAMENT (ACL) HAMSTRING RECONSTRUCTION ;  Surgeon: Sydnee Cabal, MD;  Location: [place];  Service: Orthopedics;  Laterality: Left;  . SHOULDER ARTHROSCOPY W/ LABRAL REPAIR Left 06/08/2007  . SHOULDER SURGERY Left 2008   labrum repair  . WISDOM TOOTH EXTRACTION      Family History  Problem Relation Age of Onset  . Cancer Father        throat cancer    No Known Allergies  Current Outpatient Medications on File Prior to Visit  Medication Sig Dispense Refill  . amphetamine-dextroamphetamine (ADDERALL XR) 20 MG 24 hr capsule Take 1 capsule (20 mg total) by mouth in the morning. 30 capsule 0  . amphetamine-dextroamphetamine (ADDERALL) 20 MG tablet Take 1 tablet (20 mg total) by mouth daily as needed. 30 tablet 0  . Multiple Vitamin (MULTIVITAMIN) tablet Take 1 tablet by mouth daily.    . Omega-3 Fatty Acids (OMEGA 3 500 PO) Take by mouth.     No current facility-administered medications on file prior to visit.    BP (!) 119/89 (BP Location: Right Arm, Patient Position: Sitting, Cuff Size: Large)   Pulse 71   Temp 98.4 F (36.9 C) (Oral)   Resp 16   Wt 183 lb (83 kg)   SpO2 100%   BMI 30.45 kg/m       Objective:   Physical Exam Physical Exam  Constitutional: She is oriented to person, place, and time. She appears well-developed and well-nourished. No distress.  HENT:  Head: Normocephalic and atraumatic.  Right Ear: Tympanic membrane and ear canal normal.  Left Ear: Tympanic membrane and ear canal normal.  Mouth/Throat: Oropharynx is clear and moist.  Eyes: Pupils are equal, round, and reactive to light. No scleral icterus.  Neck: Normal range of motion. No thyromegaly present.  Cardiovascular: Normal rate and regular rhythm.   No murmur heard. Pulmonary/Chest: Effort normal and breath sounds normal. No respiratory distress. He has no wheezes. She has no rales. She exhibits no tenderness.  Abdominal: Soft. Bowel sounds are  normal. She exhibits no distension and no mass. There is no tenderness. There is no rebound and no guarding.  Musculoskeletal: She exhibits no edema.  Lymphadenopathy:    She has no cervical adenopathy.  Neurological: She is alert and oriented to person, place, and time. She has normal patellar reflexes. She exhibits normal muscle tone. Coordination normal.  Skin: Skin is warm and dry. small lesion right forearm Psychiatric: She has a normal mood and affect. Her behavior is normal. Judgment and thought content normal.  Breasts: Examined lying Right: Without masses, retractions, discharge or axillary adenopathy.  Left: Without masses, retractions, discharge or axillary adenopathy.  Inguinal/mons: Normal without inguinal adenopathy  External  genitalia: Normal  BUS/Urethra/Skene's glands: Normal  Bladder: Normal  Vagina: Normal  Cervix: Normal  Uterus: normal in size, shape and contour. Midline and mobile  Adnexa/parametria:  Rt: Without masses or tenderness.  Lt: Without masses or tenderness.  Anus and perineum: Normal            Assessment & Plan:  Skin lesion- refer to dermatology for excision at pt request.  Plantar fasciitis- trial of meloxicam, continue icing, gave her exercises to do at home. Encouraged her to wear good supportive shoes.  Preventative- discussed diet/exercise.  Declines shingrix. She will send me the date of her Wynetta Emery and [employer] vaccine. Refer for mammogram. Pap performed today. Obtain routine lab work.  This visit occurred during the SARS-CoV-2 public health emergency.  Safety protocols were in place, including screening questions prior to the visit, additional usage of staff PPE, and extensive cleaning of exam room while observing appropriate contact time as indicated for disinfecting solutions.           Assessment & Plan:

## 2020-03-13 NOTE — Patient Instructions (Addendum)
Continue to ice your feet twice daily.  Start meloxicam once daily for foot pain. Do foot exercises twice daily. Schedule mammogram on the first floor. We will work on initiating the cologuard test. Please send me the date of your covid vaccine.

## 2020-03-14 ENCOUNTER — Ambulatory Visit (HOSPITAL_BASED_OUTPATIENT_CLINIC_OR_DEPARTMENT_OTHER)
Admission: RE | Admit: 2020-03-14 | Discharge: 2020-03-14 | Disposition: A | Discharge status: Home / Self Care | Payer: No Typology Code available for payment source | Source: Ambulatory Visit | Attending: Family | Admitting: Family

## 2020-03-14 ENCOUNTER — Encounter (HOSPITAL_BASED_OUTPATIENT_CLINIC_OR_DEPARTMENT_OTHER): Payer: Self-pay

## 2020-03-14 DIAGNOSIS — Z1231 Encounter for screening mammogram for malignant neoplasm of breast: Secondary | ICD-10-CM | POA: Diagnosis not present

## 2020-03-14 DIAGNOSIS — Z0000 Encounter for general adult medical examination without abnormal findings: Secondary | ICD-10-CM

## 2020-03-14 LAB — HEPATITIS C ANTIBODY
Hepatitis C Ab: NONREACTIVE
SIGNAL TO CUT-OFF: 0.01 (ref <1.00)

## 2020-03-14 LAB — CYTOLOGY - PAP
Comment: NEGATIVE
Diagnosis: NEGATIVE
High risk HPV: NEGATIVE

## 2020-03-14 LAB — HIV ANTIBODY (ROUTINE TESTING W REFLEX): HIV 1&2 Ab, 4th Generation: NONREACTIVE

## 2020-04-20 LAB — COLOGUARD: Cologuard: NEGATIVE

## 2020-04-26 ENCOUNTER — Other Ambulatory Visit: Payer: Self-pay | Admitting: Physician Assistant

## 2020-04-26 MED FILL — ADDERALL XR 20 MG CAP SA: 20 | 30 days supply | Qty: 30 | Fill #0

## 2020-04-26 MED FILL — AMPHETAMINE-DEXTROAMPHETAMI: 20 | 30 days supply | Qty: 30 | Fill #0

## 2020-04-26 NOTE — Telephone Encounter (Signed)
Next apt 08/23/2020

## 2020-04-27 LAB — COLOGUARD: COLOGUARD: NEGATIVE

## 2020-05-07 ENCOUNTER — Encounter: Payer: Self-pay | Admitting: Physician Assistant

## 2020-05-07 ENCOUNTER — Ambulatory Visit (INDEPENDENT_AMBULATORY_CARE_PROVIDER_SITE_OTHER): Payer: No Typology Code available for payment source | Admitting: Physician Assistant

## 2020-05-07 ENCOUNTER — Other Ambulatory Visit: Payer: Self-pay

## 2020-05-07 DIAGNOSIS — Z1283 Encounter for screening for malignant neoplasm of skin: Secondary | ICD-10-CM

## 2020-05-07 DIAGNOSIS — D485 Neoplasm of uncertain behavior of skin: Secondary | ICD-10-CM

## 2020-05-07 NOTE — Patient Instructions (Signed)

## 2020-05-07 NOTE — Progress Notes (Signed)
   Follow up Visit  Subjective  Melissa Mejia is a 54 y.o. female who presents for the following: Annual Exam (Concern spot on right forearm. x months comes and goes. ). Spot on right forearm that came up scaling and she picked it off and it came back a few times. It isnt sore.   Objective  Well appearing patient in no apparent distress; mood and affect are within normal limits.  A full examination was performed including head, eyes, ears, nose, lips, neck, chest, axillae, abdomen, back, buttocks, bilateral upper extremities, bilateral lower extremities, hands, feet, fingers, toes, fingernails, and toenails. All findings within normal limits unless otherwise noted below. No suspicious moles noted on back.   Objective  Right Lower Back: Full body skin exam.   Objective  Right Forearm - Anterior: Grey papule     Assessment & Plan  Screening for malignant neoplasm of skin Right Lower Back  Neoplasm of uncertain behavior of skin Right Forearm - Anterior  Skin / nail biopsy Type of biopsy: tangential   Informed consent: discussed and consent obtained   Timeout: patient name, date of birth, surgical site, and procedure verified   Procedure prep:  Patient was prepped and draped in usual sterile fashion (Non sterile) Prep type:  Chlorhexidine Anesthesia: the lesion was anesthetized in a standard fashion   Anesthetic:  1% lidocaine w/ epinephrine 1-100,000 local infiltration Instrument used: flexible razor blade   Outcome: patient tolerated procedure well   Post-procedure details: wound care instructions given    Specimen 1 - Surgical pathology Differential Diagnosis: atypia Check Margins: No

## 2020-07-03 ENCOUNTER — Other Ambulatory Visit: Payer: Self-pay | Admitting: Physician Assistant

## 2020-07-05 ENCOUNTER — Other Ambulatory Visit: Payer: Self-pay | Admitting: Physician Assistant

## 2020-07-05 MED FILL — AMPHETAMINE-DEXTROAMPHETAMI: 20 | 30 days supply | Qty: 30 | Fill #0

## 2020-07-05 MED FILL — ADDERALL XR 20 MG CAP SA: 20 | 30 days supply | Qty: 30 | Fill #0

## 2020-07-05 NOTE — Telephone Encounter (Signed)
Next apt 01/22

## 2020-08-23 ENCOUNTER — Other Ambulatory Visit: Payer: Self-pay | Admitting: Physician Assistant

## 2020-08-23 ENCOUNTER — Encounter: Payer: Self-pay | Admitting: Physician Assistant

## 2020-08-23 ENCOUNTER — Other Ambulatory Visit: Payer: Self-pay

## 2020-08-23 ENCOUNTER — Ambulatory Visit (INDEPENDENT_AMBULATORY_CARE_PROVIDER_SITE_OTHER): Payer: No Typology Code available for payment source | Admitting: Physician Assistant

## 2020-08-23 VITALS — BP 128/86 | HR 88

## 2020-08-23 DIAGNOSIS — F902 Attention-deficit hyperactivity disorder, combined type: Secondary | ICD-10-CM

## 2020-08-23 MED ORDER — AMPHETAMINE-DEXTROAMPHET ER 20 MG PO CP24: 20.0000 mg | ORAL_CAPSULE | Freq: Every morning | ORAL | 0 refills | Status: DC

## 2020-08-23 MED ORDER — AMPHETAMINE-DEXTROAMPHETAMINE 20 MG PO TABS: 20.0000 mg | ORAL_TABLET | Freq: Every day | ORAL | 0 refills | Status: DC

## 2020-08-23 MED ORDER — AMPHETAMINE-DEXTROAMPHET ER 20 MG PO CP24: 20.0000 mg | ORAL_CAPSULE | Freq: Every day | ORAL | 0 refills | Status: DC

## 2020-08-23 MED ORDER — AMPHETAMINE-DEXTROAMPHETAMINE 20 MG PO TABS: 20.0000 mg | ORAL_TABLET | Freq: Every day | ORAL | 0 refills | Status: DC | PRN

## 2020-08-23 MED FILL — AMPHETAMINE-DEXTROAMPHETAMI: 20 | 30 days supply | Qty: 30 | Fill #0

## 2020-08-23 MED FILL — ADDERALL XR 20 MG CAP SA: 20 | 30 days supply | Qty: 30 | Fill #0

## 2020-08-23 NOTE — Progress Notes (Signed)
[facility] Med Check  Patient ID: Melissa Mejia,  MRN: [id number]  PCP: Sandford Craze, NP  Date of Evaluation: 08/23/2020 Time spent:20 minutes  Chief Complaint:  Chief Complaint    ADHD; Follow-up      HISTORY/CURRENT STATUS: For 66-month med check.  Is doing well. Takes the Adderall XR and IR only prn and it's working well. They're still working well. Is able to focus and finish things in a timely manner.  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  She only works weekends and then works at [employer] that she and her husband own when she would like to.  So things are going well.  She is having no side effects from the Adderall.  Patient denies loss of interest in usual activities and is able to enjoy things.  Denies decreased energy or motivation.  Appetite has not changed.  No extreme sadness, tearfulness, or feelings of hopelessness.  Denies any changes in concentration, making decisions or remembering things.  Denies suicidal or homicidal thoughts.  Denies dizziness, syncope, seizures, numbness, tingling, tremor, tics, unsteady gait, slurred speech, confusion. Denies muscle or joint pain, stiffness, or dystonia.  Individual Medical History/ Review of Systems: Changes? :No    Past medications for mental health diagnoses include: None  Allergies: Patient has no known allergies.  Current Medications:  Current Outpatient Medications:  .  [START ON 09/22/2020] amphetamine-dextroamphetamine (ADDERALL XR) 20 MG 24 hr capsule, Take 1 capsule (20 mg total) by mouth daily., Disp: 30 capsule, Rfl: 0 .  [START ON 10/19/2020] amphetamine-dextroamphetamine (ADDERALL XR) 20 MG 24 hr capsule, Take 1 capsule (20 mg total) by mouth daily., Disp: 30 capsule, Rfl: 0 .  [START ON 09/22/2020] amphetamine-dextroamphetamine (ADDERALL) 20 MG tablet, Take 1 tablet (20 mg total) by mouth daily., Disp: 30 tablet, Rfl: 0 .  [START ON 10/19/2020]  amphetamine-dextroamphetamine (ADDERALL) 20 MG tablet, Take 1 tablet (20 mg total) by mouth daily., Disp: 30 tablet, Rfl: 0 .  Multiple Vitamin (MULTIVITAMIN) tablet, Take 1 tablet by mouth daily., Disp: , Rfl:  .  Omega-3 Fatty Acids (OMEGA 3 500 PO), Take by mouth., Disp: , Rfl:  .  amphetamine-dextroamphetamine (ADDERALL XR) 20 MG 24 hr capsule, Take 1 capsule (20 mg total) by mouth every morning., Disp: 30 capsule, Rfl: 0 .  amphetamine-dextroamphetamine (ADDERALL) 20 MG tablet, Take 1 tablet (20 mg total) by mouth daily as needed., Disp: 30 tablet, Rfl: 0 .  meloxicam (MOBIC) 7.5 MG tablet, Take 1 tablet (7.5 mg total) by mouth daily. (Patient not taking: No sig reported), Disp: 14 tablet, Rfl: 0 Medication Side Effects: none  Family Medical/ Social History: Changes? No  MENTAL HEALTH EXAM:  Blood pressure 128/86, pulse 88.There is no height or weight on file to calculate BMI.    General Appearance: Casual, Neat and Well Groomed  Eye Contact:  Good  Speech:  Clear and Coherent and Normal Rate  Volume:  Normal  Mood:  Euthymic  Affect:  Appropriate  Thought Process:  Goal Directed  Orientation:  Full (Time, Place, and Person)  Thought Content: Logical   Suicidal Thoughts:  No  Homicidal Thoughts:  No  Memory:  WNL  Judgement:  Good  Insight:  Good  Psychomotor Activity:  Normal  Concentration:  Concentration: Good and Attention Span: Good  Recall:  Good  Fund of Knowledge: Good  Language: Good  Assets:  Desire for Improvement  ADL's:  Intact  Cognition:  WNL  Prognosis:  Good    DIAGNOSES:    ICD-10-CM   1. Attention deficit hyperactivity disorder (ADHD), combined type  F90.2     Receiving Psychotherapy: No    RECOMMENDATIONS:  PDMP was reviewed. I provided 20 minutes of face-to-face time during this encounter discussing her medication, and its effectiveness.  It is completely fine for her to take it as needed.  I am glad it is working well for her. Continue  Adderall XR 20 mg every morning as needed. Continue Adderall 20 mg daily around noon as needed. Return in 6 months.  Melony Overly, PA-C

## 2020-09-10 ENCOUNTER — Telehealth: Payer: Self-pay | Admitting: Family

## 2020-09-10 NOTE — Telephone Encounter (Signed)
Patient states [facility] mailed a form requesting medical notes for them to be able to pay for cologuard due to them not receiving the information patient is getting a full bill.

## 2020-09-11 NOTE — Telephone Encounter (Signed)
Talked to patient and she will try to get the claim number and fax number so we can submit the office visit notes from July.  [facility] told her we will receive a form but I have not seen anything from them.

## 2020-10-16 MED FILL — AMPHETAMINE-DEXTROAMPHETAMI: 20 | 30 days supply | Qty: 30 | Fill #0

## 2020-10-16 MED FILL — ADDERALL XR 20 MG CAP SA: 20 | 30 days supply | Qty: 30 | Fill #0

## 2020-11-18 IMAGING — MG DIGITAL SCREENING BILAT W/ TOMO W/ CAD
8 series · 8 of 24 positions shown · non-contrast
Comparison: Previous exam(s).

CLINICAL DATA: Screening.

EXAM:
DIGITAL SCREENING BILATERAL MAMMOGRAM WITH TOMO AND CAD

[L CC synth-2D]
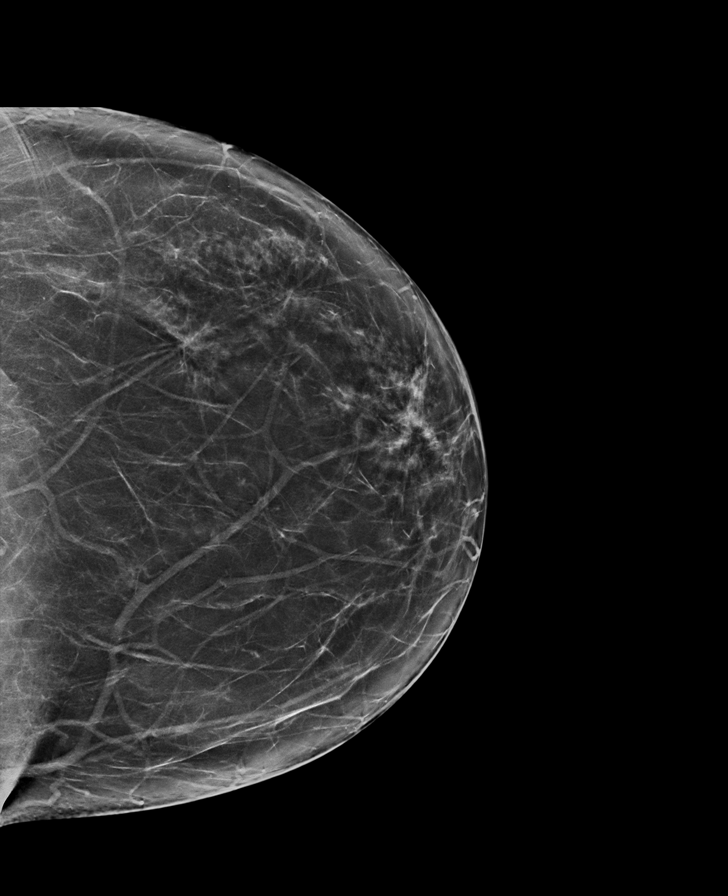

[R MLO synth-2D]
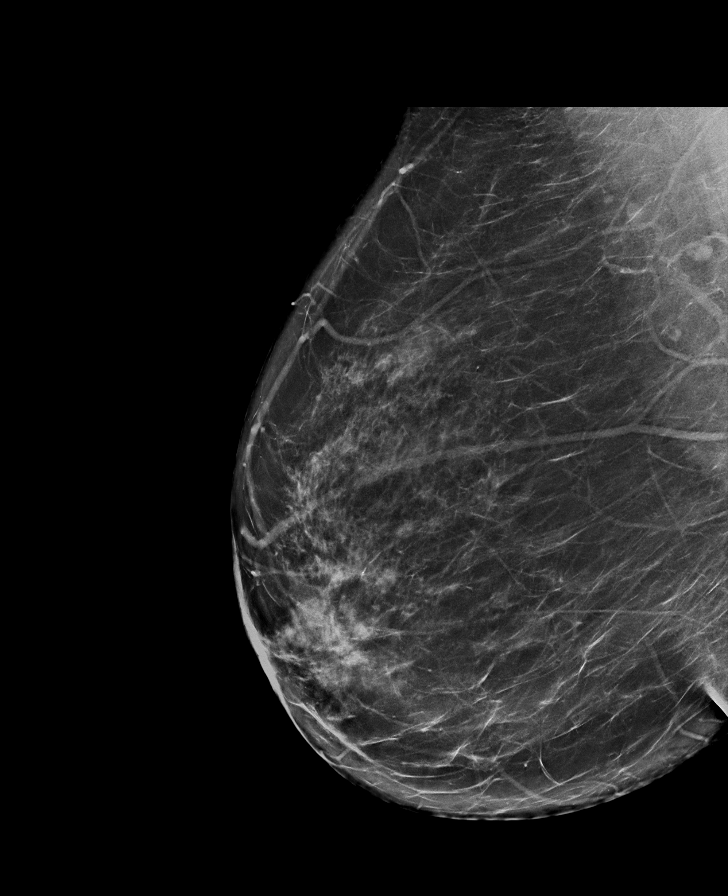

[L MLO synth-2D]
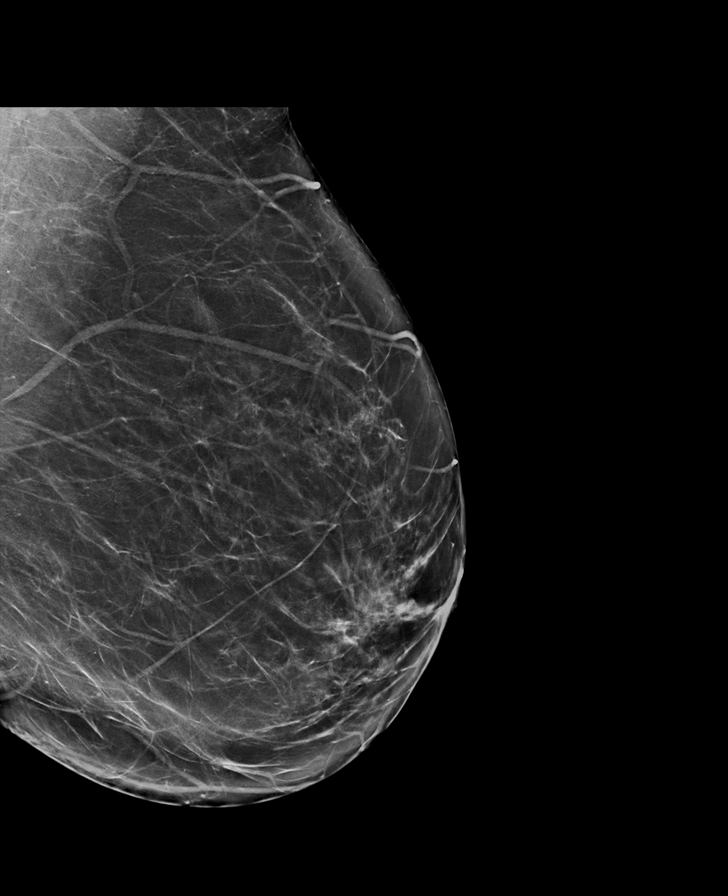

[R CC synth-2D]
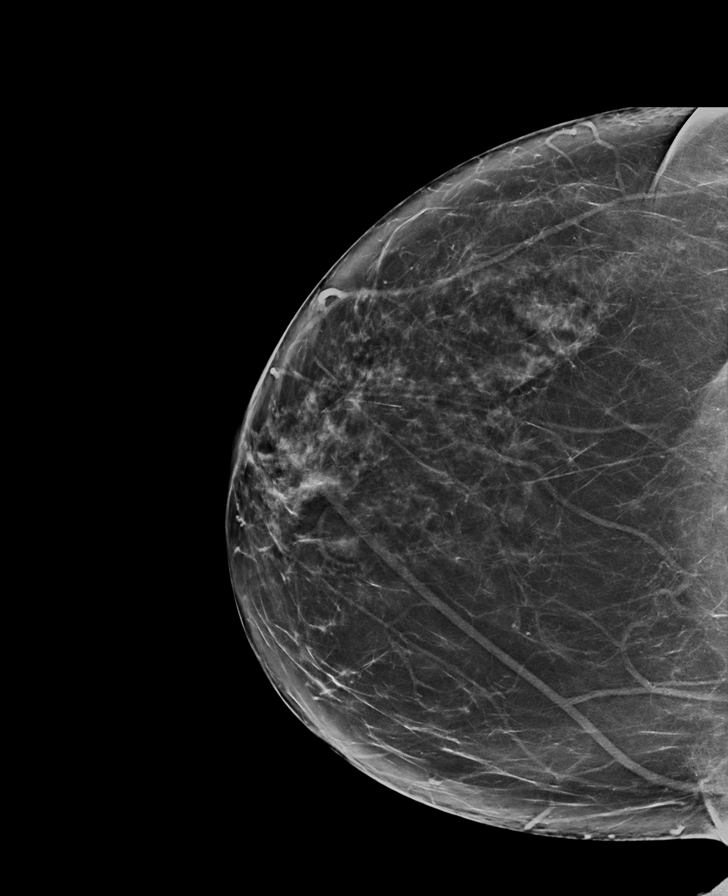

[R CC tomo · tomo slice 39/78.0]
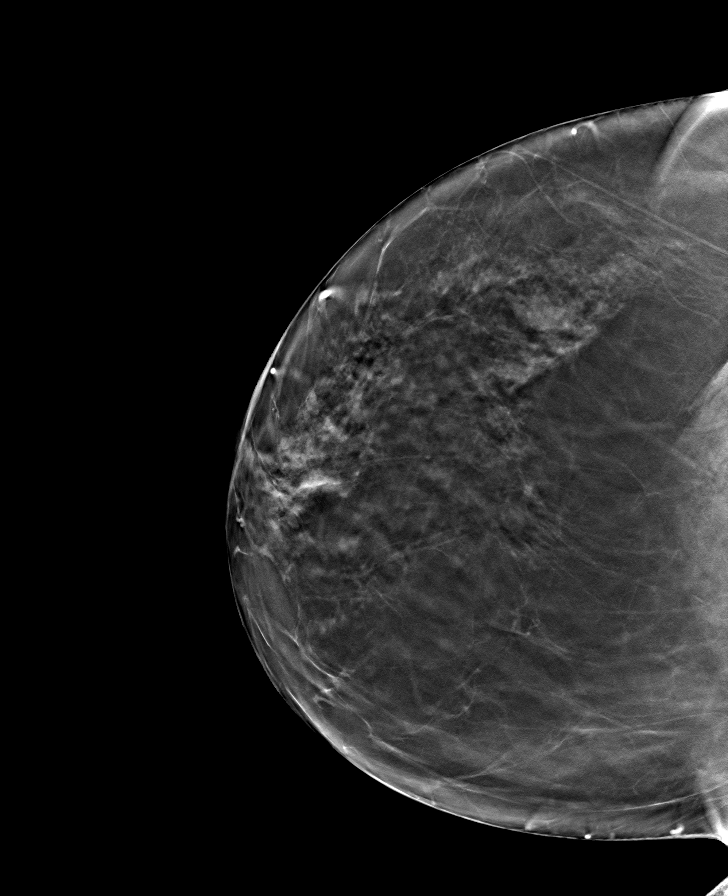

[L CC tomo · tomo slice 39/76.0]
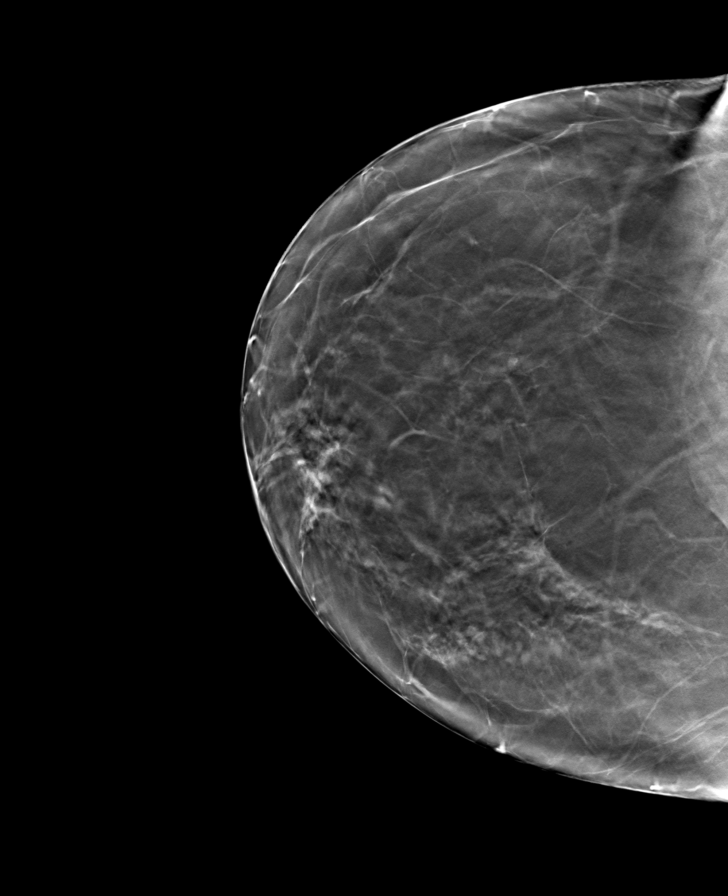

[L MLO tomo · tomo slice 39/77.0]
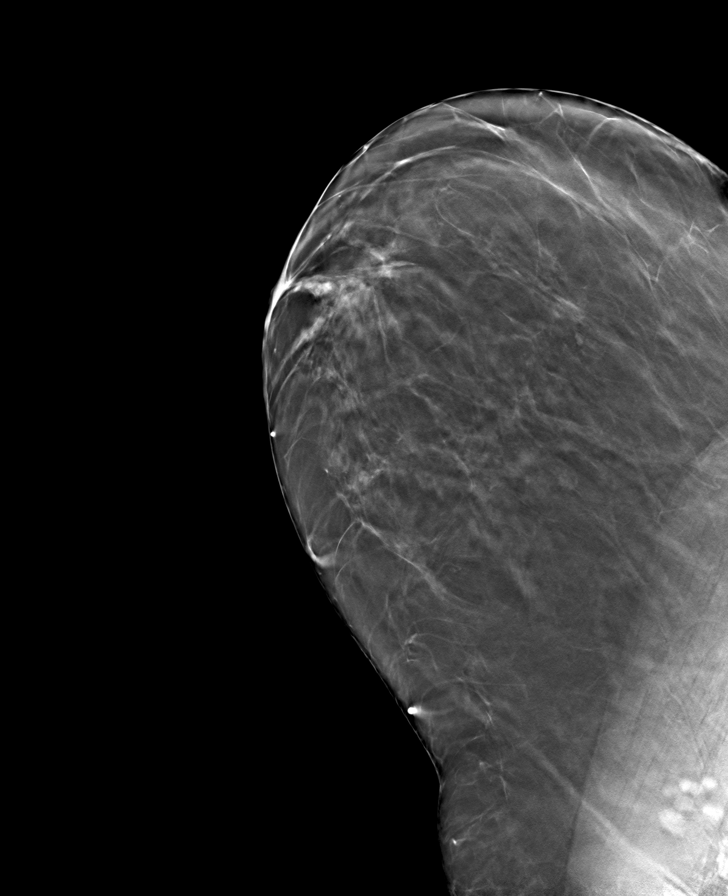

[R MLO tomo · tomo slice 41/82.0]
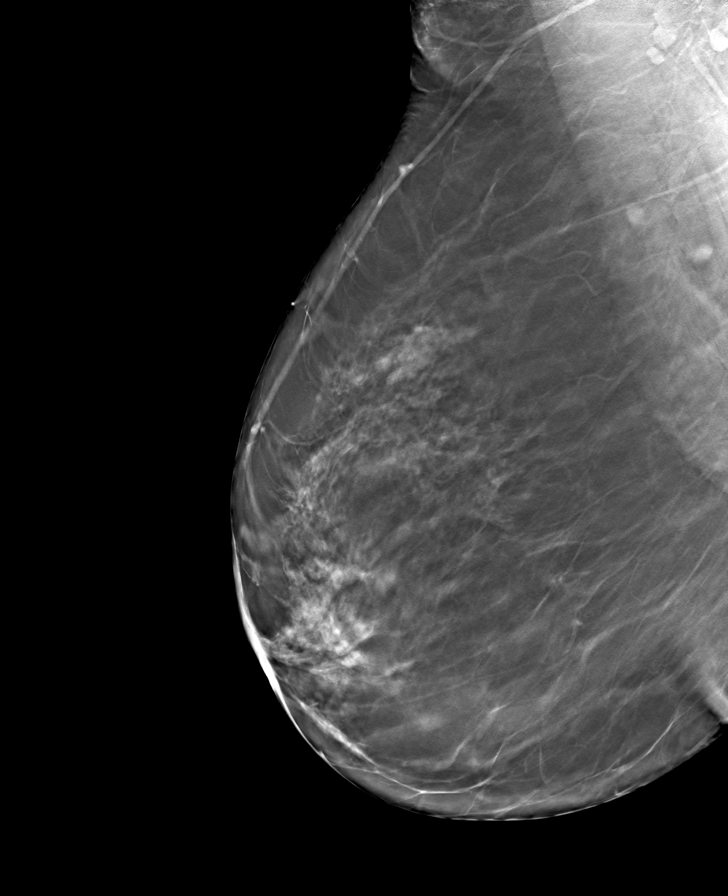

[8 of 24 positions shown; findings below may reference images not displayed]

ACR Breast Density Category b: There are scattered areas of
fibroglandular density.
FINDINGS: There are no findings suspicious for malignancy. Images were
processed with CAD.
IMPRESSION: No mammographic evidence of malignancy. A result letter of this
screening mammogram will be mailed directly to the patient.

RECOMMENDATION:
Screening mammogram in one year. (Code:[id number])

BI-RADS CATEGORY  1: Negative.

## 2020-11-29 ENCOUNTER — Other Ambulatory Visit (HOSPITAL_BASED_OUTPATIENT_CLINIC_OR_DEPARTMENT_OTHER): Payer: Self-pay

## 2020-11-29 MED FILL — Amphetamine-Dextroamphetamine Tab 20 MG: ORAL | 30 days supply | Qty: 30 | Fill #0 | Status: AC

## 2020-11-29 MED FILL — Amphetamine-Dextroamphetamine Cap ER 24HR 20 MG: ORAL | 30 days supply | Qty: 30 | Fill #0 | Status: AC

## 2021-01-30 ENCOUNTER — Other Ambulatory Visit: Payer: Self-pay | Admitting: Physician Assistant

## 2021-01-30 ENCOUNTER — Other Ambulatory Visit (HOSPITAL_BASED_OUTPATIENT_CLINIC_OR_DEPARTMENT_OTHER): Payer: Self-pay

## 2021-01-30 MED ORDER — AMPHETAMINE-DEXTROAMPHET ER 20 MG PO CP24
ORAL_CAPSULE | ORAL | 0 refills | Status: DC
  Filled 2021-01-30: qty 30, 30d supply, fill #0

## 2021-01-30 NOTE — Telephone Encounter (Signed)
Last filled 11/29/20

## 2021-02-26 ENCOUNTER — Ambulatory Visit: Payer: No Typology Code available for payment source | Admitting: Physician Assistant

## 2021-03-14 ENCOUNTER — Other Ambulatory Visit: Payer: Self-pay | Admitting: Physician Assistant

## 2021-03-14 ENCOUNTER — Other Ambulatory Visit (HOSPITAL_BASED_OUTPATIENT_CLINIC_OR_DEPARTMENT_OTHER): Payer: Self-pay

## 2021-03-14 MED ORDER — AMPHETAMINE-DEXTROAMPHET ER 20 MG PO CP24
ORAL_CAPSULE | ORAL | 0 refills | Status: DC
  Filled 2021-03-14: qty 30, 30d supply, fill #0

## 2021-03-14 MED ORDER — AMPHETAMINE-DEXTROAMPHETAMINE 20 MG PO TABS
ORAL_TABLET | ORAL | 0 refills | Status: DC
  Filled 2021-03-14: qty 30, 30d supply, fill #0

## 2021-03-14 NOTE — Telephone Encounter (Signed)
Last filled 6/16 and 4/14 appt on 8/31

## 2021-03-18 ENCOUNTER — Ambulatory Visit (INDEPENDENT_AMBULATORY_CARE_PROVIDER_SITE_OTHER): Payer: No Typology Code available for payment source | Admitting: Family

## 2021-03-18 ENCOUNTER — Other Ambulatory Visit (HOSPITAL_BASED_OUTPATIENT_CLINIC_OR_DEPARTMENT_OTHER): Payer: Self-pay

## 2021-03-18 ENCOUNTER — Other Ambulatory Visit: Payer: Self-pay

## 2021-03-18 VITALS — BP 120/83 | HR 87 | Temp 98.6°F | Resp 16 | Ht 64.0 in | Wt 181.0 lb

## 2021-03-18 DIAGNOSIS — Z0000 Encounter for general adult medical examination without abnormal findings: Secondary | ICD-10-CM | POA: Diagnosis not present

## 2021-03-18 MED ORDER — CYCLOBENZAPRINE HCL 5 MG PO TABS
5.0000 mg | ORAL_TABLET | Freq: Three times a day (TID) | ORAL | 0 refills | Status: DC | PRN
  Filled 2021-03-18: qty 30, 10d supply, fill #0

## 2021-03-18 NOTE — Patient Instructions (Signed)
Please continue your work on healthy diet, exercise and weight loss. You may use flexeril as needed for back tightness.

## 2021-03-18 NOTE — Assessment & Plan Note (Signed)
Discussed healthy diet, exercise, weight loss.  Declines covid booster at this time.  Declines shingrix.  Refer for mammo. Colo up to date.

## 2021-03-18 NOTE — Progress Notes (Signed)
Subjective:   By signing my name below, I, Melissa Mejia, attest that this documentation has been prepared under the direction and in the presence of Melissa Mejia. 03/18/2021    Patient ID: Melissa Mejia, female    DOB: 1966/01/09, 55 y.o.   MRN: [id number]  Chief Complaint  Patient presents with   Annual Exam         HPI Patient is in today for a comprehensive physical exam.  She denies having any unexpected weight change, hearing loss and rhinorrhea, visual disturbance, cough, chest pain and leg swelling, nausea, vomiting, diarrhea and blood in stool, or dysuria and frequency, for myalgias and arthralgias, rash, headaches, adenopathy, depression or anxiety at this time.  Lower back pain- She continues having low back pain. The pain does not radiate down her leg. She is interested in taking muscle relaxer's to manage her pain.  Headaches- She reports having occasional headaches after working 12 hour shifts and having elevated stress. She notes that these headaches are a results of longs work ours and not drinking enough water. She drinks liquid IV's and other electrolyte supplements to manage her symptoms and reports doing well on them.  Menopause- She reports that her last period was in October, 2021 and has not had another since then. She thinks she in in menopause. She is not interested in taking labs to confirm it or try hormone replacement therapy to manage her symptoms. She occasionally experiences a burning sensation in her abdomen.  Smell- She reports that she has not recovered all of her sense of smell. She struggles to smell bad odors. She recently started to smell citrus flavors. Depression/Anxiety- She reports having fatigue and exhaustion this past spring but reports that it has improved. She notes that these moods come and go through out the year.   Immunizations: She is not interested in getting the shingles vaccine at this time. She is not interested in getting the  Covid-19 booster vaccine at this time but is willing to get it later if there is improvements in the vaccine. She is UTD on tetanus vaccines. Diet: She has recently reduced sugar from her diet and reports that her dental health has improved. She also reports feeling healthier since cutting out sugar from her diet.  Exercise: She participates in exercise by doing piliates with her daughter and is planning to join a gym when her son joins one.  Cologuard- She completed a cologaurd in 04/2020 and had a negative result.  Pap Smear: Last completed 03/13/2020. Results normal. Repeat in 3 years.  Mammogram: Last completed 03/14/2020. Results normal. Repeat in 1 year. She is interested in setting up an appointment.  Dental: She is UTD on dental care. Vision: She is UTD on vision care.   Health Maintenance Due  Topic Date Due   Pneumococcal Vaccine 74-3 Years old (1 - PCV) Never done   Zoster Vaccines- Shingrix (1 of 2) Never done   MAMMOGRAM  03/14/2021   INFLUENZA VACCINE  03/18/2021    Past Medical History:  Diagnosis Date   ADD (attention deficit disorder)    History of esophageal reflux    Right ACL tear    Wears contact lenses     Past Surgical History:  Procedure Laterality Date   EPIGASTRIC HERNIA REPAIR N/A 01/13/2018   Procedure: PRIMARY REPAIR OF EPIGASTRIC VENTRAL HERNIA, ERAS PATHWAY;  Surgeon: Donnie Mesa, MD;  Location: [facility];  Service: General;  Laterality: N/A;   KNEE ARTHROSCOPY  WITH ANTERIOR CRUCIATE LIGAMENT (ACL) REPAIR WITH HAMSTRING GRAFT Left 02/09/2014   Procedure: LEFT KNEE ARTHROSCOPY WITH ALLOGRAFT ANTERIOR CRUCIATE LIGAMENT (ACL) HAMSTRING RECONSTRUCTION ;  Surgeon: Sydnee Cabal, MD;  Location: [facility];  Service: Orthopedics;  Laterality: Left;   SHOULDER ARTHROSCOPY W/ LABRAL REPAIR Left 06/08/2007   SHOULDER SURGERY Left 2008   labrum repair   WISDOM TOOTH EXTRACTION      Family History  Problem Relation Age of  Onset   Cancer Father        throat cancer    Social History   Socioeconomic History   Marital status: Married    Spouse name: Not on file   Number of children: Not on file   Years of education: Not on file   Highest education level: Not on file  Occupational History   Not on file  Tobacco Use   Smoking status: Former    Years: 4.00    Types: Cigarettes    Quit date: 02/09/1993    Years since quitting: 28.1   Smokeless tobacco: Never   Tobacco comments:    hx  social some day smoker  Vaping Use   Vaping Use: Never used  Substance and Sexual Activity   Alcohol use: Yes    Alcohol/week: 0.0 - 1.0 standard drinks    Comment: per month   Drug use: No   Sexual activity: Yes    Birth control/protection: Surgical    Comment: husband - vastectomy  Other Topics Concern   Not on file  Social History Narrative   RN at [employer]   Has 3 children 21, 24 and 16   Enjoys sleeping, spending time with family, spending time outside.    Enjoys cooking, skiing   Social Determinants of Radio broadcast assistant Strain: Not on file  Food Insecurity: Not on file  Transportation Needs: Not on file  Physical Activity: Not on file  Stress: Not on file  Social Connections: Not on file  Intimate Partner Violence: Not on file    Outpatient Medications Prior to Visit  Medication Sig Dispense Refill   amphetamine-dextroamphetamine (ADDERALL) 20 MG tablet TAKE 1 TABLET BY MOUTH ONCE DAILY. 30 tablet 0   Multiple Vitamin (MULTIVITAMIN) tablet Take 1 tablet by mouth daily.     Omega-3 Fatty Acids (OMEGA 3 500 PO) Take by mouth.     amphetamine-dextroamphetamine (ADDERALL XR) 20 MG 24 hr capsule Take 1 capsule (20 mg total) by mouth daily. 30 capsule 0   amphetamine-dextroamphetamine (ADDERALL XR) 20 MG 24 hr capsule Take 1 capsule (20 mg total) by mouth daily. 30 capsule 0   amphetamine-dextroamphetamine (ADDERALL XR) 20 MG 24 hr capsule TAKE 1 CAPSULE (20 MG TOTAL) BY MOUTH DAILY. 30  capsule 0   amphetamine-dextroamphetamine (ADDERALL XR) 20 MG 24 hr capsule TAKE 1 CAPSULE (20 MG TOTAL) BY MOUTH EVERY MORNING. 30 capsule 0   amphetamine-dextroamphetamine (ADDERALL XR) 20 MG 24 hr capsule TAKE 1 CAPSULE (20 MG TOTAL) BY MOUTH DAILY. 30 capsule 0   amphetamine-dextroamphetamine (ADDERALL) 20 MG tablet Take 1 tablet (20 mg total) by mouth daily. 30 tablet 0   amphetamine-dextroamphetamine (ADDERALL) 20 MG tablet Take 1 tablet (20 mg total) by mouth daily. 30 tablet 0   amphetamine-dextroamphetamine (ADDERALL) 20 MG tablet TAKE 1 TABLET BY MOUTH ONCE DAILY 30 tablet 0   amphetamine-dextroamphetamine (ADDERALL) 20 MG tablet TAKE 1 TABLET (20 MG TOTAL) BY MOUTH DAILY AS NEEDED. 30 tablet 0   meloxicam (MOBIC)  7.5 MG tablet Take 1 tablet (7.5 mg total) by mouth daily. (Patient not taking: No sig reported) 14 tablet 0   No facility-administered medications prior to visit.    No Known Allergies  Review of Systems  Constitutional:        (-)unexpected weight change (-)Adenopathy  HENT:  Negative for hearing loss.        (-)Rhinorrhea   Eyes:        (-)Visual disturbance  Respiratory:  Negative for cough.   Cardiovascular:  Negative for chest pain and leg swelling.  Gastrointestinal:  Negative for blood in stool, constipation, diarrhea, nausea and vomiting.  Genitourinary:  Negative for dysuria and frequency.  Musculoskeletal:  Negative for joint pain and myalgias.  Skin:  Negative for rash.  Neurological:  Negative for headaches.  Psychiatric/Behavioral:  Negative for depression. The patient is not nervous/anxious.       Objective:    Physical Exam Constitutional:      General: She is not in acute distress.    Appearance: Normal appearance. She is not ill-appearing.  HENT:     Head: Normocephalic and atraumatic.     Right Ear: Tympanic membrane, ear canal and external ear normal.     Left Ear: Tympanic membrane, ear canal and external ear normal.  Eyes:      Extraocular Movements: Extraocular movements intact.     Pupils: Pupils are equal, round, and reactive to Mejia.     Comments: No nystagmus  Cardiovascular:     Rate and Rhythm: Regular rhythm.     Pulses: Normal pulses.     Heart sounds: Normal heart sounds. No murmur heard.   No gallop.  Pulmonary:     Effort: Pulmonary effort is normal. No respiratory distress.     Breath sounds: Normal breath sounds. No wheezing or rales.  Chest:  Breasts:    Right: Normal.     Left: Normal.  Abdominal:     General: Bowel sounds are normal. There is no distension.     Palpations: Abdomen is soft. There is no mass.     Tenderness: There is no abdominal tenderness. There is no guarding or rebound.  Musculoskeletal:     Comments: 5/5 strength in both upper and lower extremities  Left foot bunion noted  Skin:    General: Skin is warm and dry.  Neurological:     Mental Status: She is alert and oriented to person, place, and time.     Deep Tendon Reflexes:     Reflex Scores:      Patellar reflexes are 0 on the right side and 0 on the left side. Psychiatric:        Behavior: Behavior normal.    BP 120/83 (BP Location: Right Arm, Patient Position: Sitting, Cuff Size: Small)   Pulse 87   Temp 98.6 F (37 C) (Oral)   Resp 16   Ht '5\' 4"'$  (1.626 m)   Wt 181 lb (82.1 kg)   SpO2 100%   BMI 31.07 kg/m  Wt Readings from Last 3 Encounters:  03/18/21 181 lb (82.1 kg)  03/13/20 183 lb (83 kg)  01/13/18 187 lb 12.8 oz (85.2 kg)       Assessment & Plan:   Problem List Items Addressed This Visit       Unprioritized   Preventative health care - Primary    Discussed healthy diet, exercise, weight loss.  Declines covid booster at this time.  Declines shingrix.  Refer for mammo.  [place] up to date.        Relevant Orders   MM 3D SCREEN BREAST BILATERAL     Meds ordered this encounter  Medications   cyclobenzaprine (FLEXERIL) 5 MG tablet    Sig: Take 1 tablet (5 mg total) by mouth 3 (three)  times daily as needed for muscle spasms.    Dispense:  30 tablet    Refill:  0    Order Specific Question:   Supervising Provider    Answer:   Penni Homans A [[id number]]    I, Melissa Mejia, personally preformed the services described in this documentation.  All medical record entries made by the scribe were at my direction and in my presence.  I have reviewed the chart and discharge instructions (if applicable) and agree that the record reflects my personal performance and is accurate and complete. 03/18/2021   I,Melissa Mejia,acting as a Education administrator for Nance Pear, Mejia.,have documented all relevant documentation on the behalf of Nance Pear, Mejia,as directed by  Nance Pear, Mejia while in the presence of Nance Pear, Mejia.   Nance Pear, Mejia

## 2021-04-17 ENCOUNTER — Telehealth: Payer: Self-pay | Admitting: Physician Assistant

## 2021-04-17 ENCOUNTER — Other Ambulatory Visit: Payer: Self-pay

## 2021-04-17 ENCOUNTER — Ambulatory Visit: Payer: No Typology Code available for payment source | Admitting: Physician Assistant

## 2021-04-17 ENCOUNTER — Other Ambulatory Visit (HOSPITAL_BASED_OUTPATIENT_CLINIC_OR_DEPARTMENT_OTHER): Payer: Self-pay

## 2021-04-17 MED ORDER — AMPHETAMINE-DEXTROAMPHETAMINE 20 MG PO TABS
ORAL_TABLET | ORAL | 0 refills | Status: DC
  Filled 2021-04-17: qty 30, 30d supply, fill #0

## 2021-04-17 NOTE — Telephone Encounter (Signed)
Pended.

## 2021-04-17 NOTE — Telephone Encounter (Signed)
Next visit is 06/04/21. Requesting refill on Adderall XR 20 mg called to:  [facility]  Phone:  [phone]  Fax:  [phone]

## 2021-04-25 ENCOUNTER — Other Ambulatory Visit: Payer: Self-pay | Admitting: Physician Assistant

## 2021-04-25 ENCOUNTER — Other Ambulatory Visit (HOSPITAL_BASED_OUTPATIENT_CLINIC_OR_DEPARTMENT_OTHER): Payer: Self-pay

## 2021-05-09 ENCOUNTER — Other Ambulatory Visit (HOSPITAL_BASED_OUTPATIENT_CLINIC_OR_DEPARTMENT_OTHER): Payer: Self-pay

## 2021-05-09 MED ORDER — CARESTART COVID-19 HOME TEST VI KIT
PACK | 0 refills | Status: DC
  Filled 2021-05-09: qty 4, 4d supply, fill #0

## 2021-05-22 ENCOUNTER — Encounter: Payer: Self-pay | Admitting: Physician Assistant

## 2021-05-22 ENCOUNTER — Other Ambulatory Visit: Payer: Self-pay

## 2021-05-22 ENCOUNTER — Other Ambulatory Visit (HOSPITAL_BASED_OUTPATIENT_CLINIC_OR_DEPARTMENT_OTHER): Payer: Self-pay

## 2021-05-22 ENCOUNTER — Ambulatory Visit: Payer: No Typology Code available for payment source | Admitting: Physician Assistant

## 2021-05-22 DIAGNOSIS — F902 Attention-deficit hyperactivity disorder, combined type: Secondary | ICD-10-CM | POA: Diagnosis not present

## 2021-05-22 MED ORDER — AMPHETAMINE-DEXTROAMPHET ER 20 MG PO CP24
20.0000 mg | ORAL_CAPSULE | Freq: Every day | ORAL | 0 refills | Status: DC
  Filled 2021-05-22: qty 30, 30d supply, fill #0

## 2021-05-22 MED ORDER — AMPHETAMINE-DEXTROAMPHET ER 20 MG PO CP24
20.0000 mg | ORAL_CAPSULE | Freq: Every day | ORAL | 0 refills | Status: DC
  Filled 2021-09-02: qty 30, 30d supply, fill #0

## 2021-05-22 MED ORDER — AMPHETAMINE-DEXTROAMPHET ER 20 MG PO CP24
20.0000 mg | ORAL_CAPSULE | Freq: Every day | ORAL | 0 refills | Status: DC
  Filled 2021-07-16: qty 30, 30d supply, fill #0

## 2021-05-22 NOTE — Progress Notes (Signed)
[facility]  Patient ID: Melissa Mejia,  MRN: [id number]  PCP: Debbrah Alar, NP  Date of Evaluation: 05/22/2021 Time spent:20 minutes  Chief Complaint:  Chief Complaint   ADHD; Follow-up     HISTORY/CURRENT STATUS: For routine med check.  Doing really well. Has been out of the Adderall XR for awhile. There was some type of snaffoo in sending it in, she has plenty of the IR. The XR works much better, at least is smoother. She still takes it only as needed for work and it still works well.   Patient denies loss of interest in usual activities and is able to enjoy things.  Denies decreased energy or motivation.  Appetite has not changed.  No extreme sadness, tearfulness, or feelings of hopelessness. Sleeps good most of the time.  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  Denies suicidal or homicidal thoughts.  Denies dizziness, syncope, seizures, numbness, tingling, tremor, tics, unsteady gait, slurred speech, confusion. Denies muscle or joint pain, stiffness, or dystonia.  Individual Medical History/ Review of Systems: Changes? :No    Past medications for mental health diagnoses include: None  Allergies: Patient has no known allergies.  Current Medications:  Current Outpatient Medications:    amphetamine-dextroamphetamine (ADDERALL XR) 20 MG 24 hr capsule, Take 1 capsule (20 mg total) by mouth daily., Disp: 30 capsule, Rfl: 0   [START ON 06/21/2021] amphetamine-dextroamphetamine (ADDERALL XR) 20 MG 24 hr capsule, Take 1 capsule (20 mg total) by mouth daily., Disp: 30 capsule, Rfl: 0   [START ON 07/19/2021] amphetamine-dextroamphetamine (ADDERALL XR) 20 MG 24 hr capsule, Take 1 capsule (20 mg total) by mouth daily., Disp: 30 capsule, Rfl: 0   amphetamine-dextroamphetamine (ADDERALL) 20 MG tablet, TAKE 1 TABLET BY MOUTH ONCE DAILY., Disp: 30 tablet, Rfl: 0   COVID-19 At Home  Antigen Test ([facility] COVID-19 HOME TEST) KIT, Use as directed per package instructions, Disp: 4 each, Rfl: 0   cyclobenzaprine (FLEXERIL) 5 MG tablet, Take 1 tablet (5 mg total) by mouth 3 (three) times daily as needed for muscle spasms., Disp: 30 tablet, Rfl: 0   Multiple Vitamin (MULTIVITAMIN) tablet, Take 1 tablet by mouth daily., Disp: , Rfl:  Medication Side Effects: none  Family Medical/ Social History: Changes? No  MENTAL HEALTH EXAM:  There were no vitals taken for this visit.There is no height or weight on file to calculate BMI.    General Appearance: Casual, Neat and Well Groomed  Eye Contact:  Good  Speech:  Clear and Coherent and Normal Rate  Volume:  Normal  Mood:  Euthymic  Affect:  Appropriate  Thought Process:  Goal Directed and Descriptions of Associations: Circumstantial  Orientation:  Full (Time, Place, and Person)  Thought Content: Logical   Suicidal Thoughts:  No  Homicidal Thoughts:  No  Memory:  WNL  Judgement:  Good  Insight:  Good  Psychomotor Activity:  Normal  Concentration:  Concentration: Good and Attention Span: Good  Recall:  Good  Fund of Knowledge: Good  Language: Good  Assets:  Desire for Improvement  ADL's:  Intact  Cognition: WNL  Prognosis:  Good    DIAGNOSES:    ICD-10-CM   1. Attention deficit hyperactivity disorder (ADHD), combined type  F90.2        Receiving Psychotherapy: No    RECOMMENDATIONS:  PDMP was reviewed.  Last Adderall instant release filled 04/17/2021, last  long-acting filled 01/30/2021. I provided 20 minutes of face to face time during this encounter, including time spent before and after the visit in records review, medical decision making, and charting.  Doing well so no changes are needed. She can send me a message through MyChart if there's a problem getting her meds sent in again.  Continue Adderall XR 20 mg every morning as needed. Continue Adderall 20 mg daily around noon as needed. Return in 6  months.  Donnal Moat, PA-C

## 2021-06-04 ENCOUNTER — Ambulatory Visit: Payer: No Typology Code available for payment source | Admitting: Physician Assistant

## 2021-06-14 ENCOUNTER — Other Ambulatory Visit (HOSPITAL_BASED_OUTPATIENT_CLINIC_OR_DEPARTMENT_OTHER): Payer: Self-pay

## 2021-06-14 MED ORDER — INFLUENZA VAC SPLIT QUAD 0.5 ML IM SUSY
PREFILLED_SYRINGE | INTRAMUSCULAR | 0 refills | Status: DC
  Filled 2021-06-14: qty 0.5, 1d supply, fill #0

## 2021-07-16 ENCOUNTER — Other Ambulatory Visit (HOSPITAL_BASED_OUTPATIENT_CLINIC_OR_DEPARTMENT_OTHER): Payer: Self-pay

## 2021-07-31 ENCOUNTER — Other Ambulatory Visit (HOSPITAL_BASED_OUTPATIENT_CLINIC_OR_DEPARTMENT_OTHER): Payer: Self-pay

## 2021-07-31 ENCOUNTER — Other Ambulatory Visit: Payer: Self-pay | Admitting: Physician Assistant

## 2021-08-01 ENCOUNTER — Other Ambulatory Visit (HOSPITAL_BASED_OUTPATIENT_CLINIC_OR_DEPARTMENT_OTHER): Payer: Self-pay

## 2021-08-01 MED ORDER — AMPHETAMINE-DEXTROAMPHETAMINE 20 MG PO TABS
ORAL_TABLET | ORAL | 0 refills | Status: DC
  Filled 2021-08-01: qty 30, 30d supply, fill #0

## 2021-08-01 NOTE — Telephone Encounter (Signed)
Please submit.

## 2021-09-02 ENCOUNTER — Other Ambulatory Visit (HOSPITAL_BASED_OUTPATIENT_CLINIC_OR_DEPARTMENT_OTHER): Payer: Self-pay

## 2021-09-06 ENCOUNTER — Other Ambulatory Visit (HOSPITAL_BASED_OUTPATIENT_CLINIC_OR_DEPARTMENT_OTHER): Payer: Self-pay

## 2021-11-11 ENCOUNTER — Other Ambulatory Visit: Payer: Self-pay | Admitting: Physician Assistant

## 2021-11-11 ENCOUNTER — Other Ambulatory Visit (HOSPITAL_BASED_OUTPATIENT_CLINIC_OR_DEPARTMENT_OTHER): Payer: Self-pay

## 2021-11-12 ENCOUNTER — Other Ambulatory Visit (HOSPITAL_BASED_OUTPATIENT_CLINIC_OR_DEPARTMENT_OTHER): Payer: Self-pay

## 2021-11-12 MED ORDER — AMPHETAMINE-DEXTROAMPHET ER 20 MG PO CP24
20.0000 mg | ORAL_CAPSULE | Freq: Every day | ORAL | 0 refills | Status: DC
  Filled 2021-11-12: qty 30, 30d supply, fill #0

## 2021-11-12 MED ORDER — AMPHETAMINE-DEXTROAMPHETAMINE 20 MG PO TABS
20.0000 mg | ORAL_TABLET | Freq: Every day | ORAL | 0 refills | Status: DC
  Filled 2021-11-12: qty 30, 30d supply, fill #0

## 2021-11-12 NOTE — Telephone Encounter (Signed)
Adderall refill request ?

## 2021-11-20 ENCOUNTER — Telehealth (INDEPENDENT_AMBULATORY_CARE_PROVIDER_SITE_OTHER): Payer: No Typology Code available for payment source | Admitting: Physician Assistant

## 2021-11-20 ENCOUNTER — Encounter: Payer: Self-pay | Admitting: Physician Assistant

## 2021-11-20 ENCOUNTER — Other Ambulatory Visit (HOSPITAL_BASED_OUTPATIENT_CLINIC_OR_DEPARTMENT_OTHER): Payer: Self-pay

## 2021-11-20 DIAGNOSIS — F902 Attention-deficit hyperactivity disorder, combined type: Secondary | ICD-10-CM

## 2021-11-20 MED ORDER — AMPHETAMINE-DEXTROAMPHET ER 20 MG PO CP24
20.0000 mg | ORAL_CAPSULE | Freq: Every day | ORAL | 0 refills | Status: DC
  Filled 2022-01-16: qty 30, 30d supply, fill #0

## 2021-11-20 MED ORDER — AMPHETAMINE-DEXTROAMPHETAMINE 20 MG PO TABS
20.0000 mg | ORAL_TABLET | Freq: Every day | ORAL | 0 refills | Status: DC
  Filled 2022-04-22: qty 30, 30d supply, fill #0

## 2021-11-20 MED ORDER — AMPHETAMINE-DEXTROAMPHETAMINE 20 MG PO TABS
20.0000 mg | ORAL_TABLET | Freq: Every day | ORAL | 0 refills | Status: DC
  Filled 2022-01-10: qty 30, 30d supply, fill #0

## 2021-11-20 MED ORDER — AMPHETAMINE-DEXTROAMPHETAMINE 20 MG PO TABS
20.0000 mg | ORAL_TABLET | Freq: Every day | ORAL | 0 refills | Status: DC
  Filled 2022-03-07: qty 30, 30d supply, fill #0

## 2021-11-20 MED ORDER — AMPHETAMINE-DEXTROAMPHET ER 20 MG PO CP24
20.0000 mg | ORAL_CAPSULE | Freq: Every day | ORAL | 0 refills | Status: DC
  Filled 2022-04-22: qty 30, 30d supply, fill #0

## 2021-11-20 MED ORDER — AMPHETAMINE-DEXTROAMPHET ER 20 MG PO CP24
20.0000 mg | ORAL_CAPSULE | Freq: Every day | ORAL | 0 refills | Status: DC
  Filled 2022-03-07: qty 30, 30d supply, fill #0

## 2021-11-20 NOTE — Progress Notes (Signed)
[facility] ? ?Patient ID: Melissa Mejia,  ?MRN: [id number] ? ?PCP: Debbrah Alar, NP ? ?Date of Evaluation: 11/20/2021 ?Time spent:15 minutes ? ?Chief Complaint:  ?Chief Complaint   ?ADHD; Follow-up ?  ? ?Virtual Visit via Telehealth ? ?I connected with patient by a video enabled telemedicine application with their informed consent, and verified patient privacy and that I am speaking with the correct person using two identifiers.  I am private, in my office and the patient is at work. ? ?I discussed the limitations, risks, security and privacy concerns of performing an evaluation and management service by video and the availability of in person appointments. I also discussed with the patient that there may be a patient responsible charge related to this service. The patient expressed understanding and agreed to proceed. ?  ?I discussed the assessment and treatment plan with the patient. The patient was provided an opportunity to ask questions and all were answered. The patient agreed with the plan and demonstrated an understanding of the instructions. ?  ?The patient was advised to call back or seek an in-person evaluation if the symptoms worsen or if the condition fails to improve as anticipated. ? ?I provided 15 minutes of non-face-to-face time during this encounter. ? ? ?HISTORY/CURRENT STATUS: ?For routine med check. ? ?Adderall is still working pretty well. Toward the end of her 12 hour shift, the XR 20 mg doesn't hold her as well and she has had to supplement w/ the 20 mg IR, both are only used as needed.  No side effects from the Adderall. ? ?Exciting news though! 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  The 12-hour shifts have become much more taxing.  The new job will be outpatient, 8 hours a day, 5 days a week.  Excited about starting that. ? ?Denies dizziness,  syncope, seizures, numbness, tingling, tremor, tics, unsteady gait, slurred speech, confusion. Denies muscle or joint pain, stiffness, or dystonia. ? ?Individual Medical History/ Review of Systems: Changes? :No   ? ?Past medications for mental health diagnoses include: ?None ? ?Allergies: Patient has no known allergies. ? ?Current Medications:  ?Current Outpatient Medications:  ?  amphetamine-dextroamphetamine (ADDERALL XR) 20 MG 24 hr capsule, Take 1 capsule (20 mg total) by mouth daily., Disp: 30 capsule, Rfl: 0 ?  amphetamine-dextroamphetamine (ADDERALL XR) 20 MG 24 hr capsule, Take 1 capsule (20 mg total) by mouth daily., Disp: 30 capsule, Rfl: 0 ?  amphetamine-dextroamphetamine (ADDERALL XR) 20 MG 24 hr capsule, Take 1 capsule (20 mg total) by mouth daily., Disp: 30 capsule, Rfl: 0 ?  amphetamine-dextroamphetamine (ADDERALL) 20 MG tablet, Take 1 tablet (20 mg total) by mouth daily., Disp: 30 tablet, Rfl: 0 ?  Multiple Vitamin (MULTIVITAMIN) tablet, Take 1 tablet by mouth daily., Disp: , Rfl:  ?  COVID-19 At Home Antigen Test [facility] COVID-19 HOME TEST) KIT, Use as directed per package instructions (Patient not taking: Reported on 11/20/2021), Disp: 4 each, Rfl: 0 ?  influenza vac split quadrivalent PF (FLUARIX) 0.5 ML injection, Inject into the muscle. (Patient not taking: Reported on 11/20/2021), Disp: 0.5 mL, Rfl: 0 ?Medication Side Effects: none ? ?Family Medical/ Social History: Changes? No ? ?MENTAL HEALTH EXAM: ? ?There were no vitals taken for this visit.There is no height or weight on file to calculate BMI.    ?General Appearance: Casual, Neat and Well  Groomed  ?Eye Contact:  Good  ?Speech:  Clear and Coherent and Normal Rate  ?Volume:  Normal  ?Mood:  Euthymic  ?Affect:  Appropriate  ?Thought Process:  Goal Directed and Descriptions of Associations: Circumstantial  ?Orientation:  Full (Time, Place, and Person)  ?Thought Content: Logical   ?Suicidal Thoughts:  No  ?Homicidal Thoughts:  No  ?Memory:  WNL   ?Judgement:  Good  ?Insight:  Good  ?Psychomotor Activity:  Normal  ?Concentration:  Concentration: Good and Attention Span: Good  ?Recall:  Good  ?Fund of Knowledge: Good  ?Language: Good  ?Assets:  Desire for Improvement  ?ADL's:  Intact  ?Cognition: WNL  ?Prognosis:  Good  ? ? ?DIAGNOSES:  ?  ICD-10-CM   ?1. Attention deficit hyperactivity disorder (ADHD), combined type  F90.2   ?  ? ? ?Receiving Psychotherapy: No  ? ? ?RECOMMENDATIONS:  ?PDMP was reviewed.  Last Adderall, both IR and XR, filled 11/12/2021. ?I provided 15 minutes of non-face-to-face time during this encounter, including time spent before and after the visit in records review, medical decision making, counseling pertinent to today's visit, and charting.  ?Congratulations on her new job! ?She is doing well with the current medications and doses so no changes will be made. ? ?Continue Adderall XR 20 mg every morning as needed. ?Continue Adderall 20 mg daily around noon as needed. ?Return in 6 months. ? ?Donnal Moat, PA-C  ? ? ?

## 2022-01-08 ENCOUNTER — Other Ambulatory Visit (HOSPITAL_BASED_OUTPATIENT_CLINIC_OR_DEPARTMENT_OTHER): Payer: Self-pay

## 2022-01-10 ENCOUNTER — Other Ambulatory Visit (HOSPITAL_BASED_OUTPATIENT_CLINIC_OR_DEPARTMENT_OTHER): Payer: Self-pay

## 2022-01-16 ENCOUNTER — Other Ambulatory Visit (HOSPITAL_BASED_OUTPATIENT_CLINIC_OR_DEPARTMENT_OTHER): Payer: Self-pay

## 2022-01-17 ENCOUNTER — Other Ambulatory Visit: Payer: No Typology Code available for payment source | Admitting: Student

## 2022-01-24 ENCOUNTER — Ambulatory Visit (INDEPENDENT_AMBULATORY_CARE_PROVIDER_SITE_OTHER): Payer: No Typology Code available for payment source | Admitting: Physician Assistant

## 2022-01-24 DIAGNOSIS — Z411 Encounter for cosmetic surgery: Secondary | ICD-10-CM

## 2022-01-24 NOTE — Progress Notes (Cosign Needed Addendum)
Botulinum Toxin Procedure Note  Procedure: Cosmetic botulinum toxin  Pre-operative Diagnosis: Dynamic rhytides   Post-operative Diagnosis: Same  Complications:  None  Brief history: The patient desires botulinum toxin injection of her forehead. I discussed with the patient this proposed procedure of botulinum toxin injections, which is customized depending on the particular needs of the patient. It is performed on facial rhytids as a temporary correction. The alternatives were discussed with the patient. The risks were addressed including bleeding, scarring, infection, damage to deeper structures, asymmetry, and chronic pain, which may occur infrequently after a procedure. The individual's choice to undergo a surgical procedure is based on the comparison of risks to potential benefits. Other risks include unsatisfactory results, brow ptosis, eyelid ptosis, allergic reaction, temporary paralysis, which should go away with time, bruising, blurring disturbances and delayed healing. Botulinum toxin injections do not arrest the aging process or produce permanent tightening of the eyelid.  Operative intervention maybe necessary to maintain the results of a blepharoplasty or botulinum toxin. The patient understands and wishes to proceed.  Procedure: The area was prepped with alcohol and dried with a clean gauze. Using a clean technique, the botulinum toxin was diluted with 2.5 cc of preservative-free normal saline which was slowly injected with an 18 gauge needle in a tuberculin syringes.  A 31 gauge needle was then used to inject the botulinum toxin. This mixture allow for an aliquot of 12 units per 0.1 cc in each injection site.    Subsequently the mixture was injected in the glabellar and forehead area with preservation of the temporal branch to the lateral eyebrow as well as into each lateral canthal area beginning from the lateral orbital rim medial to the zygomaticus major in 3 separate areas. A total of  108 Units of botulinum toxin (Dysport) was used. The forehead and glabellar area was injected with care to inject intramuscular only while holding pressure on the supratrochlear vessels in each area during each injection on either side of the medial corrugators. The injection proceeded vertically superiorly to the medial 2/3 of the frontalis muscle and superior 2/3 of the lateral frontalis, again with preservation of the frontal branch.  No complications were noted. Light pressure was held for 5 minutes. She was instructed explicitly in post-operative care.  Dysport LOT:  [id number]  EXP:  09/18/2023

## 2022-01-24 NOTE — Addendum Note (Signed)
Addended by: Wallace Going on: 01/24/2022 05:42 PM   Modules accepted: Level of Service

## 2022-02-04 DIAGNOSIS — Z719 Counseling, unspecified: Secondary | ICD-10-CM

## 2022-02-11 ENCOUNTER — Ambulatory Visit (INDEPENDENT_AMBULATORY_CARE_PROVIDER_SITE_OTHER): Payer: Self-pay | Admitting: Surgical

## 2022-02-11 DIAGNOSIS — Z411 Encounter for cosmetic surgery: Secondary | ICD-10-CM

## 2022-03-07 ENCOUNTER — Other Ambulatory Visit (HOSPITAL_BASED_OUTPATIENT_CLINIC_OR_DEPARTMENT_OTHER): Payer: Self-pay

## 2022-03-26 ENCOUNTER — Ambulatory Visit (INDEPENDENT_AMBULATORY_CARE_PROVIDER_SITE_OTHER): Payer: No Typology Code available for payment source | Admitting: Family

## 2022-03-26 ENCOUNTER — Encounter: Payer: Self-pay | Admitting: Family

## 2022-03-26 ENCOUNTER — Other Ambulatory Visit (HOSPITAL_BASED_OUTPATIENT_CLINIC_OR_DEPARTMENT_OTHER): Payer: Self-pay

## 2022-03-26 VITALS — BP 159/88 | HR 66 | Temp 98.2°F | Resp 16 | Ht 64.5 in | Wt 194.0 lb

## 2022-03-26 DIAGNOSIS — R030 Elevated blood-pressure reading, without diagnosis of hypertension: Secondary | ICD-10-CM | POA: Diagnosis not present

## 2022-03-26 DIAGNOSIS — Z0000 Encounter for general adult medical examination without abnormal findings: Secondary | ICD-10-CM

## 2022-03-26 DIAGNOSIS — Z1231 Encounter for screening mammogram for malignant neoplasm of breast: Secondary | ICD-10-CM

## 2022-03-26 DIAGNOSIS — R232 Flushing: Secondary | ICD-10-CM | POA: Insufficient documentation

## 2022-03-26 MED ORDER — CITALOPRAM HYDROBROMIDE 20 MG PO TABS
ORAL_TABLET | ORAL | 0 refills | Status: DC
  Filled 2022-03-26: qty 30, 30d supply, fill #0

## 2022-03-26 NOTE — Assessment & Plan Note (Signed)
Uncontrolled.  Will give trial of citalopram '20mg'$ .  1/2 tab once daily for 1 week, then increase to a full tab once daily on week two. Follow up in 1 month.

## 2022-03-26 NOTE — Progress Notes (Signed)
Subjective:   By signing my name below, I, [name], attest that this documentation has been prepared under the direction and in the presence of Debbrah Alar, NP 03/26/2022    Patient ID: Melissa Mejia, female    DOB: 1966-07-04, 56 y.o.   MRN: [id number]  Chief Complaint  Patient presents with   Annual Exam         HPI Patient is in today for a comprehensive physical exam.   Blood pressure- Her blood pressure is elevated during this visit. She notes she is under more stress due to her job.  BP Readings from Last 3 Encounters:  03/26/22 (!) 159/88  03/18/21 120/83  03/13/20 (!) 119/89   Pulse Readings from Last 3 Encounters:  03/26/22 66  03/18/21 87  03/13/20 71   Menopause: She has not had a menstrual cycle in almost 2 years. She continues having episodes of hot flashes and notes they happen during the day and night. She is interested in HRT treatment.   Social history: She recently started a new job but notes that it is a stressful period. She reports her maternal aunt was diagnosed with breast cancer, and her mother is occasionally taking thyroid medication to manage her underactive thyroid. Otherwise she has no changes to her family medical history. She occasionally drinks a couple of sips of wine every month. She does not smoke tobacco products. She does not use vaping products. She does not use drugs.  She denies unexpected weight gain, adenopathy, Fever, New moles, congestion, sinus pain, sore throat, visual disturbance, chest pain, palpitaitons, leg swelling, shortness of breath, wheezing, cough, Nausea, vomiting, diarrhea, consitpation, blood in stool, dysuria, frequency, hematuria, new joint pain, new muscle pain, frequent headaches, dizziness, depression, or anxiety.   Immunizations: She has 1 johnson and [employer] vaccine. She is UTD on tetanus vaccine. She did not receive the shingles vaccine last year and is not interested in receiving it at this  time.   Diet: She is managing a healthy diet.   Exercise: She is trying to walk her dog daily for at least a mile before work. She occasionally does it during the afternoon. She is doing piliates once a week as well. She is planning on exercising more regularly with her friend.   Cologuard: She has done a cologuard and reports her results were normal.   Pap Smear: Last completed 03/13/2020. Results are normal. Repeat in 3 years.   Mammogram: Last completed 03/14/2020. Results show: No mammographic evidence of malignancy.Repeat in 1 year. She is due at this time and is interested in setting up an appointment.   Dental:   Vision:    Health Maintenance Due  Topic Date Due   Zoster Vaccines- Shingrix (1 of 2) Never done   COVID-19 Vaccine (2 - Janssen risk series) 12/26/2019   MAMMOGRAM  03/14/2021   INFLUENZA VACCINE  03/18/2022    Past Medical History:  Diagnosis Date   ADD (attention deficit disorder)    History of esophageal reflux    Right ACL tear    Wears contact lenses     Past Surgical History:  Procedure Laterality Date   EPIGASTRIC HERNIA REPAIR N/A 01/13/2018   Procedure: PRIMARY REPAIR OF EPIGASTRIC VENTRAL HERNIA, ERAS PATHWAY;  Surgeon: Donnie Mesa, MD;  Location: [place];  Service: General;  Laterality: N/A;   KNEE ARTHROSCOPY WITH ANTERIOR CRUCIATE LIGAMENT (ACL) REPAIR WITH HAMSTRING GRAFT Left 02/09/2014   Procedure: LEFT KNEE ARTHROSCOPY WITH ALLOGRAFT  ANTERIOR CRUCIATE LIGAMENT (ACL) HAMSTRING RECONSTRUCTION ;  Surgeon: Sydnee Cabal, MD;  Location: [place];  Service: Orthopedics;  Laterality: Left;   SHOULDER ARTHROSCOPY W/ LABRAL REPAIR Left 06/08/2007   SHOULDER SURGERY Left 2008   labrum repair   WISDOM TOOTH EXTRACTION      Family History  Problem Relation Age of Onset   Hypothyroidism Mother    Cancer Father        throat cancer   Breast cancer Maternal Aunt     Social History   Socioeconomic History    Marital status: Married    Spouse name: Not on file   Number of children: Not on file   Years of education: Not on file   Highest education level: Not on file  Occupational History   Not on file  Tobacco Use   Smoking status: Former    Years: 4.00    Types: Cigarettes    Quit date: 02/09/1993    Years since quitting: 29.1   Smokeless tobacco: Never   Tobacco comments:    hx  social some day smoker  Vaping Use   Vaping Use: Never used  Substance and Sexual Activity   Alcohol use: Yes    Alcohol/week: 0.0 - 1.0 standard drinks of alcohol    Comment: per month   Drug use: No   Sexual activity: Yes    Birth control/protection: Surgical    Comment: husband - vastectomy  Other Topics Concern   Not on file  Social History Narrative   RN at [employer]   Has 3 children 34, 21 and 16   Enjoys sleeping, spending time with family, spending time outside.    Enjoys cooking, skiing   Social Determinants of Radio broadcast assistant Strain: Not on file  Food Insecurity: Not on file  Transportation Needs: Not on file  Physical Activity: Not on file  Stress: Not on file  Social Connections: Not on file  Intimate Partner Violence: Not on file    Outpatient Medications Prior to Visit  Medication Sig Dispense Refill   amphetamine-dextroamphetamine (ADDERALL XR) 20 MG 24 hr capsule Take 1 capsule (20 mg total) by mouth daily. 30 capsule 0   amphetamine-dextroamphetamine (ADDERALL XR) 20 MG 24 hr capsule Take 1 capsule (20 mg total) by mouth daily. 30 capsule 0   amphetamine-dextroamphetamine (ADDERALL XR) 20 MG 24 hr capsule Take 1 capsule (20 mg total) by mouth daily. 30 capsule 0   amphetamine-dextroamphetamine (ADDERALL) 20 MG tablet Take 1 tablet (20 mg total) by mouth daily. 30 tablet 0   amphetamine-dextroamphetamine (ADDERALL) 20 MG tablet Take 1 tablet (20 mg total) by mouth daily. 30 tablet 0   amphetamine-dextroamphetamine (ADDERALL) 20 MG tablet Take 1  tablet (20 mg total) by mouth daily. 30 tablet 0   COVID-19 At Home Antigen Test (CARESTART COVID-19 HOME TEST) KIT Use as directed per package instructions 4 each 0   influenza vac split quadrivalent PF (FLUARIX) 0.5 ML injection Inject into the muscle. 0.5 mL 0   Multiple Vitamin (MULTIVITAMIN) tablet Take 1 tablet by mouth daily.     No facility-administered medications prior to visit.    No Known Allergies  Review of Systems  Constitutional:  Negative for fever.       (-)unexpected weight change (-)Adenopathy  HENT:  Negative for congestion, sinus pain and sore throat.   Eyes:        (-)Visual disturbance  Respiratory:  Negative for cough, shortness of  breath and wheezing.   Cardiovascular:  Negative for chest pain, palpitations and leg swelling.  Gastrointestinal:  Negative for blood in stool, constipation, diarrhea, nausea and vomiting.  Genitourinary:  Negative for dysuria, frequency and hematuria.  Musculoskeletal:        (-)new muscle pain (-)new joint pain  Skin:        (-)new moles  Neurological:  Negative for dizziness and headaches.  Psychiatric/Behavioral:  Negative for depression. The patient is not nervous/anxious.        Objective:    Physical Exam Constitutional:      General: She is not in acute distress.    Appearance: Normal appearance. She is not ill-appearing.  HENT:     Head: Normocephalic and atraumatic.     Right Ear: Tympanic membrane, ear canal and external ear normal.     Left Ear: Tympanic membrane, ear canal and external ear normal.  Eyes:     Extraocular Movements: Extraocular movements intact.     Right eye: No nystagmus.     Left eye: No nystagmus.     Pupils: Pupils are equal, round, and reactive to Mejia.  Neck:     Thyroid: No thyroid tenderness.  Cardiovascular:     Rate and Rhythm: Normal rate and regular rhythm.     Heart sounds: Normal heart sounds. No murmur heard.    No gallop.     Comments: Blood pressure measured 142/77  during recheck.  Pulmonary:     Effort: Pulmonary effort is normal. No respiratory distress.     Breath sounds: Normal breath sounds. No wheezing or rales.  Abdominal:     General: There is no distension.     Palpations: Abdomen is soft.     Tenderness: There is no abdominal tenderness. There is no guarding.  Musculoskeletal:     Comments: 5/5 strength in both upper and lower extremities  Lymphadenopathy:     Cervical: No cervical adenopathy.  Skin:    General: Skin is warm and dry.  Neurological:     Mental Status: She is alert and oriented to person, place, and time.     Deep Tendon Reflexes:     Reflex Scores:      Patellar reflexes are 2+ on the right side and 2+ on the left side. Psychiatric:        Judgment: Judgment normal.     BP (!) 159/88 (BP Location: Right Arm, Patient Position: Sitting, Cuff Size: Large)   Pulse 66   Temp 98.2 F (36.8 C) (Oral)   Resp 16   Ht 5' 4.5" (1.638 m)   Wt 194 lb (88 kg)   SpO2 100%   BMI 32.79 kg/m  Wt Readings from Last 3 Encounters:  03/26/22 194 lb (88 kg)  03/18/21 181 lb (82.1 kg)  03/13/20 183 lb (83 kg)       Assessment & Plan:   Problem List Items Addressed This Visit       Unprioritized   Preventative health care - Primary    Discussed healthy diet, eercse, weight loss.  Recommended covid booster and flu shot this fall. Refer for mammogram.  Cologuard up to date.       Hot flashes    Uncontrolled.  Will give trial of citalopram 45m.  1/2 tab once daily for 1 week, then increase to a full tab once daily on week two. Follow up in 1 month.      Other Visit Diagnoses     Encounter  for screening mammogram for malignant neoplasm of breast       Relevant Orders   MM 3D SCREEN BREAST BILATERAL        Meds ordered this encounter  Medications   citalopram (CELEXA) 20 MG tablet    Sig: Take 1/2 tablet by mouth once daily for 1 week, then increase to a full tablet on week 2 thereafter    Dispense:  30 tablet     Refill:  0    Order Specific Question:   Supervising Provider    Answer:   Mosie Lukes [[id number]]    I, Nance Pear, NP, personally preformed the services described in this documentation.  All medical record entries made by the scribe were at my direction and in my presence.  I have reviewed the chart and discharge instructions (if applicable) and agree that the record reflects my personal performance and is accurate and complete. 03/26/2022   I,[name],acting as a scribe for Nance Pear, NP.,have documented all relevant documentation on the behalf of Nance Pear, NP,as directed by  Nance Pear, NP while in the presence of Nance Pear, NP.   Nance Pear, NP

## 2022-03-26 NOTE — Assessment & Plan Note (Signed)
Discussed healthy diet, eercse, weight loss.  Recommended covid booster and flu shot this fall. Refer for mammogram.  Cologuard up to date.

## 2022-03-27 ENCOUNTER — Encounter: Payer: Self-pay | Admitting: Family

## 2022-03-28 DIAGNOSIS — I10 Essential (primary) hypertension: Secondary | ICD-10-CM | POA: Insufficient documentation

## 2022-03-28 DIAGNOSIS — R030 Elevated blood-pressure reading, without diagnosis of hypertension: Secondary | ICD-10-CM | POA: Insufficient documentation

## 2022-03-28 HISTORY — DX: Essential (primary) hypertension: I10

## 2022-03-28 NOTE — Assessment & Plan Note (Signed)
BP Readings from Last 3 Encounters:  03/26/22 (!) 159/88  03/18/21 120/83  03/13/20 (!) 119/89   Elevated today. Pt will check home readings and send me via mychart.

## 2022-03-31 ENCOUNTER — Ambulatory Visit
Admission: RE | Admit: 2022-03-31 | Discharge: 2022-03-31 | Disposition: A | Discharge status: Home / Self Care | Payer: No Typology Code available for payment source | Source: Ambulatory Visit | Attending: Family | Admitting: Family

## 2022-03-31 DIAGNOSIS — Z1231 Encounter for screening mammogram for malignant neoplasm of breast: Secondary | ICD-10-CM

## 2022-04-22 ENCOUNTER — Other Ambulatory Visit (HOSPITAL_BASED_OUTPATIENT_CLINIC_OR_DEPARTMENT_OTHER): Payer: Self-pay

## 2022-04-28 ENCOUNTER — Telehealth: Payer: No Typology Code available for payment source | Admitting: Family

## 2022-04-29 NOTE — Progress Notes (Signed)
Virtual visit was rescheduled since [place] was [place].

## 2022-05-02 ENCOUNTER — Telehealth (INDEPENDENT_AMBULATORY_CARE_PROVIDER_SITE_OTHER): Payer: No Typology Code available for payment source | Admitting: Family

## 2022-05-02 ENCOUNTER — Other Ambulatory Visit (HOSPITAL_COMMUNITY): Payer: Self-pay

## 2022-05-02 ENCOUNTER — Encounter: Payer: Self-pay | Admitting: Family

## 2022-05-02 VITALS — BP 143/96 | HR 86 | Temp 98.1°F | Resp 16

## 2022-05-02 DIAGNOSIS — R232 Flushing: Secondary | ICD-10-CM

## 2022-05-02 DIAGNOSIS — I10 Essential (primary) hypertension: Secondary | ICD-10-CM | POA: Diagnosis not present

## 2022-05-02 MED ORDER — AMLODIPINE BESYLATE 2.5 MG PO TABS
2.5000 mg | ORAL_TABLET | Freq: Every day | ORAL | 0 refills | Status: DC
Start: 2022-05-02 — End: 2022-06-02
  Filled 2022-05-02: qty 90, 90d supply, fill #0

## 2022-05-02 NOTE — Assessment & Plan Note (Addendum)
BP Readings from Last 3 Encounters:  05/02/22 (!) 143/96  03/26/22 (!) 159/88  03/18/21 120/83   Uncontrolled. Will initiate amlodipine 2.5 mg once daily. She will check BP once daily for 1 week leading up to her follow up appointment and send those to me via mychart.

## 2022-05-02 NOTE — Assessment & Plan Note (Signed)
Uncontrolled. Did not tolerate citalopram. She is interested in HRT. Will refer to GYN for further discussion.

## 2022-05-02 NOTE — Progress Notes (Signed)
MyChart Video Visit    Virtual Visit via Video Note   This visit type was conducted due to national recommendations for restrictions regarding the COVID-19 Pandemic (e.g. social distancing) in an effort to limit this patient's exposure and mitigate transmission in our community. This patient is at least at moderate risk for complications without adequate follow up. This format is felt to be most appropriate for this patient at this time. Physical exam was limited by quality of the video and audio technology used for the visit. CMA was able to get the patient set up on a video visit.  Patient location: Home Patient and provider in visit Provider location: Office  I discussed the limitations of evaluation and management by telemedicine and the availability of in person appointments. The patient expressed understanding and agreed to proceed.  Visit Date: 05/02/2022  Today's healthcare provider: Nance Pear, NP     Subjective:    Patient ID: Melissa Mejia, female    DOB: 06/03/1966, 56 y.o.   MRN: [id number]  Chief Complaint  Patient presents with   Hot Flashes    Follow up, reports no improvement     HPI Patient is in today for a virtual office visit  Blood Pressure: She reports that her blood pressure readings are running high. She is not interested in starting medications. She is regularly taking 20 Mg of Adderall XR. BP Readings from Last 3 Encounters:  05/02/22 (!) 143/96  03/26/22 (!) 159/88  03/18/21 120/83   Pulse Readings from Last 3 Encounters:  05/02/22 86  03/26/22 66  03/18/21 87   Hot Flashes: She was taking her 20 Mg of Celexa for about half a week. While taking the medication, she felt nauseated. She is interested in being referred to an OB/GYN.   Past Medical History:  Diagnosis Date   ADD (attention deficit disorder)    History of esophageal reflux    Hypertension 03/28/2022   Right ACL tear    Wears contact lenses     Past Surgical  History:  Procedure Laterality Date   EPIGASTRIC HERNIA REPAIR N/A 01/13/2018   Procedure: PRIMARY REPAIR OF EPIGASTRIC VENTRAL HERNIA, ERAS PATHWAY;  Surgeon: Donnie Mesa, MD;  Location: [place];  Service: General;  Laterality: N/A;   KNEE ARTHROSCOPY WITH ANTERIOR CRUCIATE LIGAMENT (ACL) REPAIR WITH HAMSTRING GRAFT Left 02/09/2014   Procedure: LEFT KNEE ARTHROSCOPY WITH ALLOGRAFT ANTERIOR CRUCIATE LIGAMENT (ACL) HAMSTRING RECONSTRUCTION ;  Surgeon: Sydnee Cabal, MD;  Location: [place];  Service: Orthopedics;  Laterality: Left;   SHOULDER ARTHROSCOPY W/ LABRAL REPAIR Left 06/08/2007   SHOULDER SURGERY Left 2008   labrum repair   WISDOM TOOTH EXTRACTION      Family History  Problem Relation Age of Onset   Hypothyroidism Mother    Cancer Father        throat cancer   Breast cancer Maternal Aunt     Social History   Socioeconomic History   Marital status: Married    Spouse name: Not on file   Number of children: Not on file   Years of education: Not on file   Highest education level: Not on file  Occupational History   Not on file  Tobacco Use   Smoking status: Former    Years: 4.00    Types: Cigarettes    Quit date: 02/09/1993    Years since quitting: 29.2   Smokeless tobacco: Never   Tobacco comments:    hx  social some day smoker  Vaping Use   Vaping Use: Never used  Substance and Sexual Activity   Alcohol use: Yes    Alcohol/week: 0.0 - 1.0 standard drinks of alcohol    Comment: per month   Drug use: No   Sexual activity: Yes    Birth control/protection: Surgical    Comment: husband - vastectomy  Other Topics Concern   Not on file  Social History Narrative   RN at [employer]   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    Enjoys cooking, skiing   Social Determinants of Radio broadcast assistant Strain: Not on file  Food Insecurity: Not on file   Transportation Needs: Not on file  Physical Activity: Not on file  Stress: Not on file  Social Connections: Not on file  Intimate Partner Violence: Not on file    Outpatient Medications Prior to Visit  Medication Sig Dispense Refill   amphetamine-dextroamphetamine (ADDERALL XR) 20 MG 24 hr capsule Take 1 capsule (20 mg total) by mouth daily. 30 capsule 0   amphetamine-dextroamphetamine (ADDERALL XR) 20 MG 24 hr capsule Take 1 capsule (20 mg total) by mouth daily. 30 capsule 0   amphetamine-dextroamphetamine (ADDERALL XR) 20 MG 24 hr capsule Take 1 capsule (20 mg total) by mouth daily. 30 capsule 0   amphetamine-dextroamphetamine (ADDERALL) 20 MG tablet Take 1 tablet (20 mg total) by mouth daily. 30 tablet 0   amphetamine-dextroamphetamine (ADDERALL) 20 MG tablet Take 1 tablet (20 mg total) by mouth daily. 30 tablet 0   amphetamine-dextroamphetamine (ADDERALL) 20 MG tablet Take 1 tablet (20 mg total) by mouth daily. 30 tablet 0   citalopram (CELEXA) 20 MG tablet Take 1/2 tablet by mouth once daily for 1 week, then increase to a full tablet on week 2 thereafter 30 tablet 0   COVID-19 At Home Antigen Test (CARESTART COVID-19 HOME TEST) KIT Use as directed per package instructions 4 each 0   influenza vac split quadrivalent PF (FLUARIX) 0.5 ML injection Inject into the muscle. 0.5 mL 0   Multiple Vitamin (MULTIVITAMIN) tablet Take 1 tablet by mouth daily.     No facility-administered medications prior to visit.    No Known Allergies  ROS See HPI    Objective:    Physical Exam  BP (!) 143/96 (BP Location: Left Arm, Patient Position: Sitting)   Pulse 86   Temp 98.1 F (36.7 C) (Oral)   Resp 16  Wt Readings from Last 3 Encounters:  03/26/22 194 lb (88 kg)  03/18/21 181 lb (82.1 kg)  03/13/20 183 lb (83 kg)    Gen: Awake, alert, no acute distress Resp: Breathing is even and non-labored Psych: calm/pleasant demeanor Neuro: Alert and Oriented x 3, + facial symmetry, speech is  clear.    Assessment & Plan:   Problem List Items Addressed This Visit       Unprioritized   Hypertension    BP Readings from Last 3 Encounters:  05/02/22 (!) 143/96  03/26/22 (!) 159/88  03/18/21 120/83  Uncontrolled. Will initiate amlodipine 2.5 mg once daily. She will check BP once daily for 1 week leading up to her follow up appointment and send those to me via mychart.       Relevant Medications   amLODipine (NORVASC) 2.5 MG tablet   Hot flashes - Primary    Uncontrolled. Did not tolerate citalopram. She is interested in HRT. Will  refer to GYN for further discussion.       Relevant Medications   amLODipine (NORVASC) 2.5 MG tablet   Other Relevant Orders   Ambulatory referral to Obstetrics / Gynecology   Meds ordered this encounter  Medications   amLODipine (NORVASC) 2.5 MG tablet    Sig: Take 1 tablet (2.5 mg total) by mouth daily.    Dispense:  90 tablet    Refill:  0    Order Specific Question:   Supervising Provider    Answer:   Penni Homans A [[id number]]    I discussed the assessment and treatment plan with the patient. The patient was provided an opportunity to ask questions and all were answered. The patient agreed with the plan and demonstrated an understanding of the instructions.   The patient was advised to call back or seek an in-person evaluation if the symptoms worsen or if the condition fails to improve as anticipated.   I,Amber Collins,acting as a Education administrator for [employer], NP.,have documented all relevant documentation on the behalf of Nance Pear, NP,as directed by  Nance Pear, NP while in the presence of Nance Pear, NP.   Nance Pear, NP [employer] [facility] [employer] [phone] (phone) [phone] (fax)  [place]

## 2022-06-02 ENCOUNTER — Ambulatory Visit (INDEPENDENT_AMBULATORY_CARE_PROVIDER_SITE_OTHER): Payer: No Typology Code available for payment source | Admitting: Family

## 2022-06-02 ENCOUNTER — Other Ambulatory Visit (HOSPITAL_COMMUNITY): Payer: Self-pay

## 2022-06-02 VITALS — BP 115/84 | HR 73 | Temp 98.4°F | Resp 16 | Wt 198.0 lb

## 2022-06-02 DIAGNOSIS — I10 Essential (primary) hypertension: Secondary | ICD-10-CM | POA: Diagnosis not present

## 2022-06-02 LAB — COMPREHENSIVE METABOLIC PANEL
ALT: 21 U/L (ref 0–35)
AST: 18 U/L (ref 0–37)
Albumin: 4 g/dL (ref 3.5–5.2)
Alkaline Phosphatase: 81 U/L (ref 39–117)
BUN: 17 mg/dL (ref 6–23)
CO2: 27 mEq/L (ref 19–32)
Calcium: 9 mg/dL (ref 8.4–10.5)
Chloride: 105 mEq/L (ref 96–112)
Creatinine, Ser: 0.98 mg/dL (ref 0.40–1.20)
GFR: 65.13 mL/min (ref >60.00)
Glucose, Bld: 96 mg/dL (ref 70–99)
Potassium: 3.9 mEq/L (ref 3.5–5.1)
Sodium: 141 mEq/L (ref 135–145)
Total Bilirubin: 0.5 mg/dL (ref 0.2–1.2)
Total Protein: 6.6 g/dL (ref 6.0–8.3)

## 2022-06-02 MED ORDER — AMLODIPINE BESYLATE 5 MG PO TABS
5.0000 mg | ORAL_TABLET | Freq: Every day | ORAL | 1 refills | Status: DC
  Filled 2022-06-02: qty 90, 90d supply, fill #0

## 2022-06-02 NOTE — Progress Notes (Signed)
Subjective:     Patient ID: Melissa Mejia, female    DOB: 1966-01-21, 56 y.o.   MRN: [id number]  Chief Complaint  Patient presents with   Hypertension    Here for follow up    Hypertension   Patient is in today for follow up of her blood pressure. Last visit we initiated amlodipine 2.50m once daily. States that she has not taken her medication yet this AM.  Reports that her home readings sbp 1300-762  diastolic 826-33'H  She has been   BP Readings from Last 3 Encounters:  06/02/22 115/84  05/02/22 (!) 143/96  03/26/22 (!) 159/88    Health Maintenance Due  Topic Date Due   Zoster Vaccines- Shingrix (1 of 2) Never done   COVID-19 Vaccine (2 - Janssen risk series) 12/26/2019   INFLUENZA VACCINE  03/18/2022    Past Medical History:  Diagnosis Date   ADD (attention deficit disorder)    History of esophageal reflux    Hypertension 03/28/2022   Right ACL tear    Wears contact lenses     Past Surgical History:  Procedure Laterality Date   EPIGASTRIC HERNIA REPAIR N/A 01/13/2018   Procedure: PRIMARY REPAIR OF EPIGASTRIC VENTRAL HERNIA, ERAS PATHWAY;  Surgeon: TDonnie Mesa MD;  [place] [facility]  Service: General;  Laterality: N/A;   KNEE ARTHROSCOPY WITH ANTERIOR CRUCIATE LIGAMENT (ACL) REPAIR WITH HAMSTRING GRAFT Left 02/09/2014   Procedure: LEFT KNEE ARTHROSCOPY WITH ALLOGRAFT ANTERIOR CRUCIATE LIGAMENT (ACL) HAMSTRING RECONSTRUCTION ;  Surgeon: RSydnee Cabal MD;  Location[place] [facility]  Service: Orthopedics;  Laterality: Left;   SHOULDER ARTHROSCOPY W/ LABRAL REPAIR Left 06/08/2007   SHOULDER SURGERY Left 2008   labrum repair   WISDOM TOOTH EXTRACTION      Family History  Problem Relation Age of Onset   Hypothyroidism Mother    Cancer Father        throat cancer   Breast cancer Maternal Aunt     Social History   Socioeconomic History   Marital status: Married    Spouse name: Not on file   Number of children: Not on file    Years of education: Not on file   Highest education level: Not on file  Occupational History   Not on file  Tobacco Use   Smoking status: Former    Years: 4.00    Types: Cigarettes    Quit date: 02/09/1993    Years since quitting: 29.3   Smokeless tobacco: Never   Tobacco comments:    hx  social some day smoker  Vaping Use   Vaping Use: Never used  Substance and Sexual Activity   Alcohol use: Yes    Alcohol/week: 0.0 - 1.0 standard drinks of alcohol    Comment: per month   Drug use: No   Sexual activity: Yes    Birth control/protection: Surgical    Comment: husband - vastectomy  Other Topics Concern   Not on file  Social History Narrative   RN at [employer]   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    Enjoys cooking, skiing   Social Determinants of HRadio broadcast assistantStrain: Not on file  Food Insecurity: Not on file  Transportation Needs: Not on file  Physical Activity: Not on file  Stress: Not on file  Social Connections: Not on file  Intimate Partner Violence: Not on file  Outpatient Medications Prior to Visit  Medication Sig Dispense Refill   amphetamine-dextroamphetamine (ADDERALL XR) 20 MG 24 hr capsule Take 1 capsule (20 mg total) by mouth daily. 30 capsule 0   amphetamine-dextroamphetamine (ADDERALL XR) 20 MG 24 hr capsule Take 1 capsule (20 mg total) by mouth daily. 30 capsule 0   amphetamine-dextroamphetamine (ADDERALL XR) 20 MG 24 hr capsule Take 1 capsule (20 mg total) by mouth daily. 30 capsule 0   amphetamine-dextroamphetamine (ADDERALL) 20 MG tablet Take 1 tablet (20 mg total) by mouth daily. 30 tablet 0   amphetamine-dextroamphetamine (ADDERALL) 20 MG tablet Take 1 tablet (20 mg total) by mouth daily. 30 tablet 0   amphetamine-dextroamphetamine (ADDERALL) 20 MG tablet Take 1 tablet (20 mg total) by mouth daily. 30 tablet 0   influenza vac split quadrivalent PF  (FLUARIX) 0.5 ML injection Inject into the muscle. 0.5 mL 0   Multiple Vitamin (MULTIVITAMIN) tablet Take 1 tablet by mouth daily.     amLODipine (NORVASC) 2.5 MG tablet Take 1 tablet (2.5 mg total) by mouth daily. 90 tablet 0   citalopram (CELEXA) 20 MG tablet Take 1/2 tablet by mouth once daily for 1 week, then increase to a full tablet on week 2 thereafter 30 tablet 0   COVID-19 At Home Antigen Test (CARESTART COVID-19 HOME TEST) KIT Use as directed per package instructions 4 each 0   No facility-administered medications prior to visit.    No Known Allergies  ROS See HPI    Objective:    Physical Exam Constitutional:      General: She is not in acute distress.    Appearance: Normal appearance. She is well-developed.  HENT:     Head: Normocephalic and atraumatic.     Right Ear: External ear normal.     Left Ear: External ear normal.  Eyes:     General: No scleral icterus. Neck:     Thyroid: No thyromegaly.  Cardiovascular:     Rate and Rhythm: Normal rate and regular rhythm.     Heart sounds: Normal heart sounds. No murmur heard. Pulmonary:     Effort: Pulmonary effort is normal. No respiratory distress.     Breath sounds: Normal breath sounds. No wheezing.  Musculoskeletal:     Cervical back: Neck supple.  Skin:    General: Skin is warm and dry.  Neurological:     Mental Status: She is alert and oriented to person, place, and time.  Psychiatric:        Mood and Affect: Mood normal.        Behavior: Behavior normal.        Thought Content: Thought content normal.        Judgment: Judgment normal.     BP 115/84   Pulse 73   Temp 98.4 F (36.9 C) (Oral)   Resp 16   Wt 198 lb (89.8 kg)   SpO2 100%   BMI 33.46 kg/m  Wt Readings from Last 3 Encounters:  06/02/22 198 lb (89.8 kg)  03/26/22 194 lb (88 kg)  03/18/21 181 lb (82.1 kg)       Assessment & Plan:   Problem List Items Addressed This Visit       Unprioritized   Hypertension - Primary    Blood  pressure initial reading was high as well as some of her home diastolic readings.  Will increase amlodipine from 2.5 to 76m once daily and pt will send me some follow up readings.  Relevant Medications   amLODipine (NORVASC) 5 MG tablet   Other Relevant Orders   Comp Met (CMET)    I have discontinued [facility] COVID-19 Home Test, citalopram, and amLODipine. I am also having her start on amLODipine. Additionally, I am having her maintain her multivitamin, influenza vac split quadrivalent PF, amphetamine-dextroamphetamine, amphetamine-dextroamphetamine, amphetamine-dextroamphetamine, amphetamine-dextroamphetamine, amphetamine-dextroamphetamine, and amphetamine-dextroamphetamine.  Meds ordered this encounter  Medications   amLODipine (NORVASC) 5 MG tablet    Sig: Take 1 tablet (5 mg total) by mouth daily.    Dispense:  90 tablet    Refill:  1    Please place rx on file    Order Specific Question:   Supervising Provider    Answer:   Penni Homans A [[id number]]

## 2022-06-02 NOTE — Assessment & Plan Note (Signed)
Blood pressure initial reading was high as well as some of her home diastolic readings.  Will increase amlodipine from 2.5 to '5mg'$  once daily and pt will send me some follow up readings.

## 2022-08-25 ENCOUNTER — Other Ambulatory Visit: Payer: Self-pay | Admitting: Physician Assistant

## 2022-08-25 ENCOUNTER — Other Ambulatory Visit (HOSPITAL_BASED_OUTPATIENT_CLINIC_OR_DEPARTMENT_OTHER): Payer: Self-pay

## 2022-08-26 NOTE — Telephone Encounter (Signed)
Please call to schedule an appt, was due in October.

## 2022-08-29 NOTE — Telephone Encounter (Signed)
LVM to schedule appt.

## 2022-09-02 ENCOUNTER — Other Ambulatory Visit (HOSPITAL_BASED_OUTPATIENT_CLINIC_OR_DEPARTMENT_OTHER): Payer: Self-pay

## 2022-09-02 ENCOUNTER — Ambulatory Visit: Payer: No Typology Code available for payment source | Admitting: Family

## 2022-09-02 MED ORDER — AMPHETAMINE-DEXTROAMPHETAMINE 20 MG PO TABS
20.0000 mg | ORAL_TABLET | Freq: Every day | ORAL | 0 refills | Status: DC
  Filled 2022-09-02: qty 21, 21d supply, fill #0

## 2022-09-17 ENCOUNTER — Ambulatory Visit: Payer: No Typology Code available for payment source | Admitting: Family

## 2022-09-22 ENCOUNTER — Encounter: Payer: Self-pay | Admitting: Family

## 2022-09-22 ENCOUNTER — Other Ambulatory Visit (HOSPITAL_BASED_OUTPATIENT_CLINIC_OR_DEPARTMENT_OTHER): Payer: Self-pay

## 2022-09-22 ENCOUNTER — Ambulatory Visit (INDEPENDENT_AMBULATORY_CARE_PROVIDER_SITE_OTHER): Payer: 59 | Admitting: Family

## 2022-09-22 ENCOUNTER — Ambulatory Visit (INDEPENDENT_AMBULATORY_CARE_PROVIDER_SITE_OTHER): Payer: 59 | Admitting: Physician Assistant

## 2022-09-22 ENCOUNTER — Encounter: Payer: Self-pay | Admitting: Physician Assistant

## 2022-09-22 VITALS — BP 111/89 | HR 94 | Temp 98.4°F | Resp 16 | Wt 202.0 lb

## 2022-09-22 DIAGNOSIS — G4700 Insomnia, unspecified: Secondary | ICD-10-CM | POA: Diagnosis not present

## 2022-09-22 DIAGNOSIS — I10 Essential (primary) hypertension: Secondary | ICD-10-CM

## 2022-09-22 DIAGNOSIS — E559 Vitamin D deficiency, unspecified: Secondary | ICD-10-CM

## 2022-09-22 DIAGNOSIS — F909 Attention-deficit hyperactivity disorder, unspecified type: Secondary | ICD-10-CM

## 2022-09-22 DIAGNOSIS — B029 Zoster without complications: Secondary | ICD-10-CM | POA: Insufficient documentation

## 2022-09-22 DIAGNOSIS — R232 Flushing: Secondary | ICD-10-CM

## 2022-09-22 DIAGNOSIS — F902 Attention-deficit hyperactivity disorder, combined type: Secondary | ICD-10-CM

## 2022-09-22 MED ORDER — VALACYCLOVIR HCL 1 G PO TABS
1000.0000 mg | ORAL_TABLET | Freq: Three times a day (TID) | ORAL | 0 refills | Status: AC
  Filled 2022-09-22: qty 21, 7d supply, fill #0

## 2022-09-22 MED ORDER — K2-D3 10,000 10000-45 UNIT-MCG PO CAPS: 1.0000 | ORAL_CAPSULE | ORAL | Status: AC

## 2022-09-22 MED ORDER — AMPHETAMINE-DEXTROAMPHET ER 20 MG PO CP24
20.0000 mg | ORAL_CAPSULE | Freq: Every day | ORAL | 0 refills | Status: DC
Start: 2022-09-22 — End: 2022-10-31
  Filled 2022-09-22: qty 30, 30d supply, fill #0

## 2022-09-22 MED ORDER — TRAZODONE HCL 50 MG PO TABS
25.0000 mg | ORAL_TABLET | Freq: Every evening | ORAL | 1 refills | Status: DC | PRN
  Filled 2022-09-22: qty 30, 15d supply, fill #0

## 2022-09-22 NOTE — Assessment & Plan Note (Signed)
New. Rx with Valtrex.  Consider Shingrix next visit.

## 2022-09-22 NOTE — Progress Notes (Signed)
Subjective:   By signing my name below, I, [name], attest that this documentation has been prepared under the direction and in the presence of Debbrah Alar, NP. 09/22/2022   Patient ID: Melissa Mejia, female    DOB: Aug 12, 1966, 57 y.o.   MRN: [id number]  Chief Complaint  Patient presents with   Hypertension    Here for follow up    Hypertension   Patient is in today for a follow up visit.   Left rib pain: She complains of left rib pain for the past 2 days. She has a rash going across her tender area. She notes coughing frequently prior to her pain and thinks it may have contributed to her pain.   Vitamin D: She is taking 10000 units vitamin D 3x daily supplements daily since finding she has low levels during her last blood work 3 weeks ago.   HRT: She is planning on undergoing HRT treatment to manage her hot flashes and bad sleep. She is cutting down on caffeine and alcohol to try and improve her sleep and finds no improvements. She has an upcomming appointment with her GYN specialist for further evaluation.   Blood pressure: Her blood pressure is doing well during this visit. Her last dose of 5 mg amlodipine was last week. Her blood pressure typically measures well at home. She notes having an episode of elevated blood pressure during a stressfully time, otherwise her blood pressure is normal.  BP Readings from Last 3 Encounters:  09/22/22 111/89  06/02/22 115/84  05/02/22 (!) 143/96   Pulse Readings from Last 3 Encounters:  09/22/22 94  06/02/22 73  05/02/22 86   Adderall: She is not taking adderall daily and instead taking it a couple days a week. She does not know if taking it a couple days versus every day is affecting her sleep.    Past Medical History:  Diagnosis Date   ADD (attention deficit disorder)    History of esophageal reflux    Hypertension 03/28/2022   Right ACL tear    Wears contact lenses     Past Surgical History:  Procedure Laterality  Date   EPIGASTRIC HERNIA REPAIR N/A 01/13/2018   Procedure: PRIMARY REPAIR OF EPIGASTRIC VENTRAL HERNIA, ERAS PATHWAY;  Surgeon: Donnie Mesa, MD;  Location: [place];  Service: General;  Laterality: N/A;   KNEE ARTHROSCOPY WITH ANTERIOR CRUCIATE LIGAMENT (ACL) REPAIR WITH HAMSTRING GRAFT Left 02/09/2014   Procedure: LEFT KNEE ARTHROSCOPY WITH ALLOGRAFT ANTERIOR CRUCIATE LIGAMENT (ACL) HAMSTRING RECONSTRUCTION ;  Surgeon: Sydnee Cabal, MD;  Location: [facility];  Service: Orthopedics;  Laterality: Left;   SHOULDER ARTHROSCOPY W/ LABRAL REPAIR Left 06/08/2007   SHOULDER SURGERY Left 2008   labrum repair   WISDOM TOOTH EXTRACTION      Family History  Problem Relation Age of Onset   Hypothyroidism Mother    Cancer Father        throat cancer   Breast cancer Maternal Aunt     Social History   Socioeconomic History   Marital status: Married    Spouse name: Not on file   Number of children: Not on file   Years of education: Not on file   Highest education level: Not on file  Occupational History   Not on file  Tobacco Use   Smoking status: Former    Years: 4.00    Types: Cigarettes    Quit date: 02/09/1993    Years since quitting: 29.6  Smokeless tobacco: Never   Tobacco comments:    hx  social some day smoker  Vaping Use   Vaping Use: Never used  Substance and Sexual Activity   Alcohol use: Yes    Alcohol/week: 0.0 - 1.0 standard drinks of alcohol    Comment: per month   Drug use: No   Sexual activity: Yes    Birth control/protection: Surgical    Comment: husband - vastectomy  Other Topics Concern   Not on file  Social History Narrative   RN at [employer]   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    Enjoys cooking, skiing   Social Determinants of Radio broadcast assistant Strain: Not on file  Food Insecurity: Not on file  Transportation Needs: Not on  file  Physical Activity: Not on file  Stress: Not on file  Social Connections: Not on file  Intimate Partner Violence: Not on file    Outpatient Medications Prior to Visit  Medication Sig Dispense Refill   amLODipine (NORVASC) 5 MG tablet Take 1 tablet (5 mg total) by mouth daily. 90 tablet 1   amphetamine-dextroamphetamine (ADDERALL) 20 MG tablet Take 1 tablet (20 mg total) by mouth daily. 21 tablet 0   Multiple Vitamin (MULTIVITAMIN) tablet Take 1 tablet by mouth daily.     amphetamine-dextroamphetamine (ADDERALL XR) 20 MG 24 hr capsule Take 1 capsule (20 mg total) by mouth daily. 30 capsule 0   amphetamine-dextroamphetamine (ADDERALL XR) 20 MG 24 hr capsule Take 1 capsule (20 mg total) by mouth daily. 30 capsule 0   amphetamine-dextroamphetamine (ADDERALL XR) 20 MG 24 hr capsule Take 1 capsule (20 mg total) by mouth daily. 30 capsule 0   amphetamine-dextroamphetamine (ADDERALL) 20 MG tablet Take 1 tablet (20 mg total) by mouth daily. 30 tablet 0   amphetamine-dextroamphetamine (ADDERALL) 20 MG tablet Take 1 tablet (20 mg total) by mouth daily. 30 tablet 0   influenza vac split quadrivalent PF (FLUARIX) 0.5 ML injection Inject into the muscle. 0.5 mL 0   No facility-administered medications prior to visit.    No Known Allergies  Review of Systems  Musculoskeletal:        (+)left rib pain  Skin:  Positive for rash (across left side ribs to left back).       Objective:    Physical Exam Constitutional:      General: She is not in acute distress.    Appearance: Normal appearance. She is not ill-appearing.  HENT:     Head: Normocephalic and atraumatic.     Right Ear: External ear normal.     Left Ear: External ear normal.  Eyes:     Extraocular Movements: Extraocular movements intact.     Pupils: Pupils are equal, round, and reactive to Mejia.  Cardiovascular:     Rate and Rhythm: Normal rate and regular rhythm.     Heart sounds: Normal heart sounds. No murmur heard.    No  gallop.  Pulmonary:     Effort: Pulmonary effort is normal. No respiratory distress.     Breath sounds: Normal breath sounds. No wheezing or rales.  Skin:    General: Skin is warm and dry.     Comments: Raised erythematous patches left lower abdomen and strip of erythema noted overlying left flank  Neurological:     Mental Status: She is alert and oriented to person, place, and time.  Psychiatric:  Judgment: Judgment normal.     BP 111/89 (BP Location: Right Arm, Patient Position: Sitting, Cuff Size: Large)   Pulse 94   Temp 98.4 F (36.9 C) (Oral)   Resp 16   Wt 202 lb (91.6 kg)   SpO2 99%   BMI 34.14 kg/m  Wt Readings from Last 3 Encounters:  09/22/22 202 lb (91.6 kg)  06/02/22 198 lb (89.8 kg)  03/26/22 194 lb (88 kg)       Assessment & Plan:  Herpes zoster without complication Assessment & Plan: New. Rx with Valtrex.  Consider Shingrix next visit.    Primary hypertension Assessment & Plan: BP Readings from Last 3 Encounters:  09/22/22 111/89  06/02/22 115/84  05/02/22 (!) 143/96   She has not taken her medication in 1.5 weeks.  Advised pt OK to remain off of amlodipine.      Attention deficit hyperactivity disorder (ADHD), unspecified ADHD type Assessment & Plan: Stable. This is being managed by psychiatry.    Hot flashes Assessment & Plan: Uncontrolled. She has an appointment with GYN to discuss HRT.    Vitamin D deficiency Assessment & Plan: Plan to check follow up vit D level next visit. Continues vitamin D supplement 10000 iu 3x weekly.   Other orders -     valACYclovir HCl; Take 1 tablet (1,000 mg total) by mouth 3 (three) times daily for 7 days.  Dispense: 21 tablet; Refill: 0 -     K2-D3 10,000; Take 1 Capful by mouth 3 (three) times a week.    I, Nance Pear, NP, personally preformed the services described in this documentation.  All medical record entries made by the scribe were at my direction and in my presence.  I have  reviewed the chart and discharge instructions (if applicable) and agree that the record reflects my personal performance and is accurate and complete. 09/22/2022   I,[name],acting as a scribe for Nance Pear, NP.,have documented all relevant documentation on the behalf of Nance Pear, NP,as directed by  Nance Pear, NP while in the presence of Nance Pear, NP.   Nance Pear, NP

## 2022-09-22 NOTE — Assessment & Plan Note (Signed)
Uncontrolled. She has an appointment with GYN to discuss HRT.

## 2022-09-22 NOTE — Progress Notes (Signed)
[facility] Check  Patient ID: Melissa Mejia,  MRN: [id number]  PCP: Debbrah Alar, NP  Date of Evaluation: 09/22/2022 Time spent:25 minutes  Chief Complaint:   Chief Complaint   ADHD; Follow-up   Virtual Visit via Telehealth  I connected with patient by telephone, with their informed consent, and verified patient privacy and that I am speaking with the correct person using two identifiers.  I am private, in my office and the patient is at work.  I discussed the limitations, risks, security and privacy concerns of performing an evaluation and management service by telephone and the availability of in person appointments. I also discussed with the patient that there may be a patient responsible charge related to this service. The patient expressed understanding and agreed to proceed.   I discussed the assessment and treatment plan with the patient. The patient was provided an opportunity to ask questions and all were answered. The patient agreed with the plan and demonstrated an understanding of the instructions.   The patient was advised to call back or seek an in-person evaluation if the symptoms worsen or if the condition fails to improve as anticipated.  I provided 25 minutes of non-face-to-face time during this encounter.  HISTORY/CURRENT STATUS: For routine med check.  Stressful at work, but getting better.  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  Things have calmed down though.  Her blood pressure had been running high during the day, her PCP put her on amlodipine which she is only doing as needed now.  She monitors her blood pressure and it is usually normal now off medications.  She saw her PCP this morning for routine checkup and blood pressure was 111/89.  Has trouble staying asleep now.  Has not had a good night sleep in a long time.  Feels like it is hormonal.  She is practicing good sleep hygiene every night,  no caffeine past midmorning.  No blue light, etc., nothing has helped.  She is looking into HRT, thinking that may be helpful for a little while.  She will talk to her GYN about that.  Adderall is still working well, states that attention is good without easy distractibility.  Able to focus on things and finish tasks to completion.   Patient is able to enjoy things.  Energy and motivation are good.  No extreme sadness, tearfulness, or feelings of hopelessness.   ADLs and personal hygiene are normal.  No changes in memory.  Appetite has not changed.  Weight is stable.  No complaints of extreme anxiety.  Denies suicidal or homicidal thoughts.  Denies dizziness, syncope, seizures, numbness, tingling, tremor, tics, unsteady gait, slurred speech, confusion. Denies muscle or joint pain, stiffness, or dystonia.  Individual Medical History/ Review of Systems: Changes? :No    Past medications for mental health diagnoses include: None  Allergies: Patient has no known allergies.  Current Medications:  Current Outpatient Medications:    amLODipine (NORVASC) 5 MG tablet, Take 1 tablet (5 mg total) by mouth daily., Disp: 90 tablet, Rfl: 1   amphetamine-dextroamphetamine (ADDERALL XR) 20 MG 24 hr capsule, Take 1 capsule (20 mg total) by mouth daily., Disp: 30 capsule, Rfl: 0   amphetamine-dextroamphetamine (ADDERALL) 20 MG tablet, Take 1 tablet (20 mg total) by mouth daily. (Patient taking differently: Take 20 mg by mouth daily. prn), Disp: 21 tablet, Rfl: 0   Multiple Vitamin (MULTIVITAMIN) tablet, Take 1 tablet by mouth daily., Disp: ,  Rfl:    traZODone (DESYREL) 50 MG tablet, Take 0.5-2 tablets (25-100 mg total) by mouth at bedtime as needed for sleep., Disp: 30 tablet, Rfl: 1   Vitamin D-Vitamin K (K2-D3 10,000) 10000-45 UNIT-MCG CAPS, Take 1 Capful by mouth 3 (three) times a week., Disp: , Rfl:    valACYclovir (VALTREX) 1000 MG tablet, Take 1 tablet (1,000 mg total) by mouth 3 (three) times daily for 7  days. (Patient not taking: Reported on 09/22/2022), Disp: 21 tablet, Rfl: 0 Medication Side Effects: none  Family Medical/ Social History: Changes? No  MENTAL HEALTH EXAM:  There were no vitals taken for this visit.There is no height or weight on file to calculate BMI.    General Appearance:  unable to assess  Eye Contact:   unable to assess  Speech:  Clear and Coherent and Normal Rate  Volume:  Normal  Mood:  Euthymic  Affect:   unable to assess  Thought Process:  Goal Directed and Descriptions of Associations: Circumstantial  Orientation:  Full (Time, Place, and Person)  Thought Content: Logical   Suicidal Thoughts:  No  Homicidal Thoughts:  No  Memory:  WNL  Judgement:  Good  Insight:  Good  Psychomotor Activity:   unable to assess  Concentration:  Concentration: Good and Attention Span: Good  Recall:  Good  Fund of Knowledge: Good  Language: Good  Assets:  Desire for Improvement  ADL's:  Intact  Cognition: WNL  Prognosis:  Good   DIAGNOSES:    ICD-10-CM   1. Attention deficit hyperactivity disorder (ADHD), combined type  F90.2     2. Insomnia, unspecified type  G47.00       Receiving Psychotherapy: No   RECOMMENDATIONS:  PDMP was reviewed.  Last Adderall, IR filled 09/02/2022.  XR filled 04/22/2022.   I provided 25 minutes of non-face-to-face time during this encounter, including time spent before and after the visit in records review, medical decision making, counseling pertinent to today's visit, and charting.   We discussed sleep hygiene.  It sounds like she is already doing everything that is normally recommended.  She has tried melatonin and it has not been beneficial.  She tried 1 Ambien 1 time and it did help her go to sleep and stay asleep but she does not want to feel like she did the next day.  We discussed different options of trazodone or mirtazapine.  We agreed to try the trazodone.  Benefits, risks, and side effects were discussed and she accepts. She is  stable as far as the ADHD goes.  She takes the Adderall as needed and it is effective so no change needed.  Continue Adderall XR 20 mg every morning as needed. Continue Adderall 20 mg daily around noon as needed. Start trazodone 50 mg, 1/2-2 p.o. nightly as needed sleep. Return in 6 months.  Donnal Moat, PA-C

## 2022-09-22 NOTE — Assessment & Plan Note (Signed)
Stable. This is being managed by psychiatry.

## 2022-09-22 NOTE — Assessment & Plan Note (Signed)
Plan to check follow up vit D level next visit. Continues vitamin D supplement 10000 iu 3x weekly.

## 2022-09-22 NOTE — Assessment & Plan Note (Signed)
BP Readings from Last 3 Encounters:  09/22/22 111/89  06/02/22 115/84  05/02/22 (!) 143/96   She has not taken her medication in 1.5 weeks.  Advised pt OK to remain off of amlodipine.

## 2022-10-15 ENCOUNTER — Other Ambulatory Visit (HOSPITAL_BASED_OUTPATIENT_CLINIC_OR_DEPARTMENT_OTHER): Payer: Self-pay

## 2022-10-15 ENCOUNTER — Encounter (HOSPITAL_BASED_OUTPATIENT_CLINIC_OR_DEPARTMENT_OTHER): Payer: Self-pay | Admitting: Obstetrics and Gynecology

## 2022-10-15 ENCOUNTER — Ambulatory Visit (HOSPITAL_BASED_OUTPATIENT_CLINIC_OR_DEPARTMENT_OTHER): Payer: 59 | Admitting: Obstetrics and Gynecology

## 2022-10-15 VITALS — BP 135/97 | HR 79 | Ht 64.5 in | Wt 201.4 lb

## 2022-10-15 DIAGNOSIS — N951 Menopausal and female climacteric states: Secondary | ICD-10-CM

## 2022-10-15 MED ORDER — ESTRADIOL-LEVONORGESTREL 0.045-0.015 MG/DAY TD PTWK
1.0000 | MEDICATED_PATCH | TRANSDERMAL | 12 refills | Status: DC
  Filled 2022-10-15: qty 4, 28d supply, fill #0
  Filled 2022-10-31 – 2022-11-06 (×2): qty 4, 28d supply, fill #1

## 2022-10-15 NOTE — Progress Notes (Signed)
NEW GYNECOLOGY PATIENT Patient name: Melissa Mejia MRN [id number]  Date of birth: 1965-11-30 Chief Complaint:   Office visit (Discuss HRT)     History:  Ovie L Kosowski is a 57 y.o. No obstetric history on file. being seen today for discussion of HRT.   2-3 years since last menses. Having issues with sleep, hot flashes including night sweats, brain fog, and 20# weight gain.   Did not have issues with HTN until new job started. Previously taking addreall on the weekends and now taking 5 days a week, and took an extended pill today due to al ong day.  Tried anti-depressant without success. Not taking trazadone. Feels sleep is disrupted by night sweats. No vaginal dryness.  10 year ASCVD risk? 2.45%  Contraindications:  Hx of breast cancer? no CHD? No  Prior VTE? no Active liver disese? no Unexplained vaginal bleeding? no High-risk endometrial cancer? no TIA? No No current tobacco use      Gynecologic History No LMP recorded. Patient is perimenopausal. Contraception: post menopausal status Last Pap:     Component Value Date/Time   DIAGPAP  03/13/2020 0957    - Negative for intraepithelial lesion or malignancy (NILM)   DIAGPAP  12/26/2016 0000    NEGATIVE FOR INTRAEPITHELIAL LESIONS OR MALIGNANCY.   [place] Negative 03/13/2020 0957   ADEQPAP  03/13/2020 0957    Satisfactory for evaluation; transformation zone component PRESENT.   ADEQPAP  12/26/2016 0000    Satisfactory for evaluation  endocervical/transformation zone component PRESENT.   Last Mammogram: 03/2022.  BIRADS 1 Last Colonoscopy: none seen   Obstetric History OB History  No obstetric history on file.    Past Medical History:  Diagnosis Date   ADD (attention deficit disorder)    History of esophageal reflux    Hypertension 03/28/2022   Right ACL tear    Wears contact lenses     Past Surgical History:  Procedure Laterality Date   EPIGASTRIC HERNIA REPAIR N/A 01/13/2018   Procedure: PRIMARY REPAIR OF  EPIGASTRIC VENTRAL HERNIA, ERAS PATHWAY;  Surgeon: Donnie Mesa, MD;  Location: [place];  Service: General;  Laterality: N/A;   KNEE ARTHROSCOPY WITH ANTERIOR CRUCIATE LIGAMENT (ACL) REPAIR WITH HAMSTRING GRAFT Left 02/09/2014   Procedure: LEFT KNEE ARTHROSCOPY WITH ALLOGRAFT ANTERIOR CRUCIATE LIGAMENT (ACL) HAMSTRING RECONSTRUCTION ;  Surgeon: Sydnee Cabal, MD;  Location: [place];  Service: Orthopedics;  Laterality: Left;   SHOULDER ARTHROSCOPY W/ LABRAL REPAIR Left 06/08/2007   SHOULDER SURGERY Left 2008   labrum repair   WISDOM TOOTH EXTRACTION      Current Outpatient Medications on File Prior to Visit  Medication Sig Dispense Refill   amphetamine-dextroamphetamine (ADDERALL XR) 20 MG 24 hr capsule Take 1 capsule (20 mg total) by mouth daily. 30 capsule 0   amphetamine-dextroamphetamine (ADDERALL) 20 MG tablet Take 1 tablet (20 mg total) by mouth daily. (Patient taking differently: Take 20 mg by mouth daily. prn) 21 tablet 0   Multiple Vitamin (MULTIVITAMIN) tablet Take 1 tablet by mouth daily.     Vitamin D-Vitamin K (K2-D3 10,000) 10000-45 UNIT-MCG CAPS Take 1 Capful by mouth 3 (three) times a week.     amLODipine (NORVASC) 5 MG tablet Take 1 tablet (5 mg total) by mouth daily. (Patient not taking: Reported on 10/15/2022) 90 tablet 1   traZODone (DESYREL) 50 MG tablet Take 0.5-2 tablets (25-100 mg total) by mouth at bedtime as needed for sleep. (Patient not taking: Reported on 10/15/2022) 30 tablet 1  No current facility-administered medications on file prior to visit.    No Known Allergies  Social History:  reports that she quit smoking about 29 years ago. Her smoking use included cigarettes. She has never used smokeless tobacco. She reports current alcohol use. She reports that she does not use drugs.  Family History  Problem Relation Age of Onset   Hypothyroidism Mother    Cancer Father        throat cancer   Breast cancer Maternal Aunt      The following portions of the patient's history were reviewed and updated as appropriate: allergies, current medications, past family history, past medical history, past social history, past surgical history and problem list.  Review of Systems Pertinent items noted in HPI and remainder of comprehensive ROS otherwise negative.  Physical Exam:  BP (!) 135/97 (BP Location: Left Arm, Patient Position: Sitting, Cuff Size: Large)   Pulse 79   Ht 5' 4.5" (1.638 m) Comment: Reported  Wt 201 lb 6.4 oz (91.4 kg)   BMI 34.04 kg/m  Physical Exam Vitals and nursing note reviewed.  Constitutional:      Appearance: Normal appearance.  Cardiovascular:     Rate and Rhythm: Normal rate.  Pulmonary:     Effort: Pulmonary effort is normal.     Breath sounds: Normal breath sounds.  Neurological:     General: No focal deficit present.     Mental Status: She is alert and oriented to person, place, and time.  Psychiatric:        Mood and Affect: Mood normal.        Behavior: Behavior normal.        Thought Content: Thought content normal.        Judgment: Judgment normal.        Assessment and Plan:   1. Vasomotor symptoms due to menopause Hx consistent with VSM along with other perimenopausal changes. <10 years since onset of menopause and < 72 years old. 10 year CVD risk 2.45%. Discussed indication of HRT is reduction in hot flashes and not additional perimenopausal symptoms. Also reviewed importance of stress management to help with sleep and control of BP. Likely iatrogenic increase in BP due to stress and use of adderall on work days.   - estradiol-levonorgestrel (CLIMARAPRO) 0.045-0.015 MG/DAY; Place 1 patch onto the skin once a week.  Dispense: 4 patch; Refill: 12   - We discussed the treatment options for menopause as well as indications - we discussed both HRT and non-HRT.  - Discussed the benefits of each and relative effectiveness.  - Discussed goals of therapy i.e. reduction of  hot flashes (not complete resolution). Reviewed full response takes up to 2-3 months, including for hormone therapy. - Discussed if HRT we do shortest amount of time at lowest dose. We discussed annual attempts at coming of the HRT  - We discussed the differences in modes of therapy for HRT -- patch vs oral therapy.  - Discussed risks of HRT: E+P = breast cancer, clotting, MI/Stroke. Discussed risk of E alone. We reviewed that limits of data from the [facility] regarding breast cancer impact - only progesterone used in that study was provera which is biologically active in the best. We MAY reduce that risk by doing a different progesterone based therapy I.e. norethindrone. We reviewed the indication and necessity for progesterone and that estrogen alone in those with a uterus have an increased risk of endometrial cancer - Discussed genitourinary symptoms i.e. urinary issues, vaginal dryness  and dyspareunia and that for these symptoms, local treatment is best. - She would like: HRT  Follow up response in 4-6 weeks for any improvement. Pt to also check difference in cost of different combination patch. If combination patch insufficient, switch to E2 patch with po progesterone.   Routine preventative health maintenance measures emphasized. Please refer to After Visit Summary for other counseling recommendations.   Follow-up: Return for GYN Follow Up.      Darliss Cheney, MD Obstetrician & Gynecologist, [facility] [employer], [place]

## 2022-10-16 ENCOUNTER — Other Ambulatory Visit: Payer: Self-pay

## 2022-10-31 ENCOUNTER — Other Ambulatory Visit: Payer: Self-pay

## 2022-10-31 ENCOUNTER — Other Ambulatory Visit (HOSPITAL_BASED_OUTPATIENT_CLINIC_OR_DEPARTMENT_OTHER): Payer: Self-pay

## 2022-10-31 ENCOUNTER — Telehealth: Payer: Self-pay | Admitting: Physician Assistant

## 2022-10-31 MED ORDER — AMPHETAMINE-DEXTROAMPHET ER 15 MG PO CP24
15.0000 mg | ORAL_CAPSULE | ORAL | 0 refills | Status: DC
  Filled 2022-10-31: qty 30, 30d supply, fill #0

## 2022-10-31 MED ORDER — AMPHETAMINE-DEXTROAMPHETAMINE 20 MG PO TABS
20.0000 mg | ORAL_TABLET | Freq: Every day | ORAL | 0 refills | Status: DC
  Filled 2022-10-31: qty 30, 30d supply, fill #0

## 2022-10-31 NOTE — Telephone Encounter (Signed)
Melissa Mejia called at 1:45 to request refill of her regular Adderall 20mg .  Appt 03/23/23.  Send to [facility] on [address].  Also she wants to discuss dose change of her Adderall XR.  Please call to discuss.

## 2022-10-31 NOTE — Telephone Encounter (Signed)
Patient wants to decrease there Adderall XR from 20 to 15, said she had started HRT patch and since then 20 mg seems too strong. Was also asking for RF of XR and IR. Pended.

## 2022-10-31 NOTE — Telephone Encounter (Signed)
Please triage. Thanks.

## 2022-11-06 ENCOUNTER — Other Ambulatory Visit (HOSPITAL_BASED_OUTPATIENT_CLINIC_OR_DEPARTMENT_OTHER): Payer: Self-pay

## 2022-11-18 ENCOUNTER — Ambulatory Visit (INDEPENDENT_AMBULATORY_CARE_PROVIDER_SITE_OTHER): Payer: Self-pay | Admitting: Physician Assistant

## 2022-11-18 DIAGNOSIS — Z411 Encounter for cosmetic surgery: Secondary | ICD-10-CM

## 2022-11-18 NOTE — Progress Notes (Signed)
Preoperative Dx: Solar lentigo  Postoperative Dx:  same  Procedure: laser to left upper extremity  Anesthesia: none  Description of Procedure:  Risks and complications were explained to the patient. Consent was confirmed and signed. Time out was called and all information was confirmed to be correct. The area  area was prepped with alcohol and wiped dry. The [place] laser was set at 13 J/cm2. The left arm was lasered. The patient tolerated the procedure well and there were no complications. The patient is to follow up in 4 weeks.

## 2022-12-04 ENCOUNTER — Encounter: Payer: Self-pay | Admitting: Obstetrics and Gynecology

## 2022-12-05 ENCOUNTER — Other Ambulatory Visit (HOSPITAL_COMMUNITY): Payer: Self-pay

## 2022-12-05 ENCOUNTER — Other Ambulatory Visit: Payer: Self-pay | Admitting: Obstetrics and Gynecology

## 2022-12-05 DIAGNOSIS — N951 Menopausal and female climacteric states: Secondary | ICD-10-CM

## 2022-12-05 MED ORDER — ESTRADIOL 1 MG PO TABS
1.0000 mg | ORAL_TABLET | Freq: Every day | ORAL | 6 refills | Status: DC
  Filled 2022-12-05: qty 60, 60d supply, fill #0

## 2022-12-05 MED ORDER — PROGESTERONE MICRONIZED 100 MG PO CAPS
100.0000 mg | ORAL_CAPSULE | Freq: Every day | ORAL | 6 refills | Status: DC
  Filled 2022-12-05: qty 10, 10d supply, fill #0
  Filled 2022-12-05: qty 50, 50d supply, fill #0
  Filled 2022-12-19: qty 60, 60d supply, fill #1
  Filled 2023-02-19: qty 60, 60d supply, fill #2
  Filled 2023-04-24: qty 60, 60d supply, fill #3
  Filled 2023-06-30: qty 60, 60d supply, fill #4

## 2022-12-19 ENCOUNTER — Other Ambulatory Visit (HOSPITAL_COMMUNITY): Payer: Self-pay

## 2022-12-19 ENCOUNTER — Other Ambulatory Visit (HOSPITAL_BASED_OUTPATIENT_CLINIC_OR_DEPARTMENT_OTHER): Payer: Self-pay

## 2022-12-20 ENCOUNTER — Other Ambulatory Visit (HOSPITAL_BASED_OUTPATIENT_CLINIC_OR_DEPARTMENT_OTHER): Payer: Self-pay

## 2022-12-22 ENCOUNTER — Ambulatory Visit: Payer: 59 | Admitting: Family

## 2022-12-23 ENCOUNTER — Other Ambulatory Visit (HOSPITAL_COMMUNITY): Payer: Self-pay

## 2022-12-23 ENCOUNTER — Encounter: Payer: Self-pay | Admitting: Obstetrics and Gynecology

## 2022-12-24 ENCOUNTER — Other Ambulatory Visit (HOSPITAL_BASED_OUTPATIENT_CLINIC_OR_DEPARTMENT_OTHER): Payer: Self-pay

## 2022-12-24 ENCOUNTER — Other Ambulatory Visit: Payer: Self-pay | Admitting: Physician Assistant

## 2022-12-25 ENCOUNTER — Other Ambulatory Visit (HOSPITAL_BASED_OUTPATIENT_CLINIC_OR_DEPARTMENT_OTHER): Payer: Self-pay

## 2022-12-26 ENCOUNTER — Other Ambulatory Visit (HOSPITAL_BASED_OUTPATIENT_CLINIC_OR_DEPARTMENT_OTHER): Payer: Self-pay

## 2022-12-26 MED ORDER — AMPHETAMINE-DEXTROAMPHETAMINE 20 MG PO TABS
20.0000 mg | ORAL_TABLET | Freq: Every day | ORAL | 0 refills | Status: DC
  Filled 2022-12-26: qty 30, 30d supply, fill #0

## 2022-12-26 MED ORDER — AMPHETAMINE-DEXTROAMPHET ER 15 MG PO CP24
15.0000 mg | ORAL_CAPSULE | ORAL | 0 refills | Status: DC
  Filled 2022-12-26: qty 30, 30d supply, fill #0

## 2022-12-29 ENCOUNTER — Other Ambulatory Visit: Payer: Self-pay | Admitting: Obstetrics and Gynecology

## 2022-12-29 ENCOUNTER — Other Ambulatory Visit (HOSPITAL_COMMUNITY): Payer: Self-pay

## 2022-12-29 DIAGNOSIS — N951 Menopausal and female climacteric states: Secondary | ICD-10-CM

## 2022-12-29 MED ORDER — ESTRADIOL 0.05 MG/24HR TD PTTW
1.0000 | MEDICATED_PATCH | TRANSDERMAL | 12 refills | Status: DC
  Filled 2022-12-29: qty 8, 28d supply, fill #0
  Filled 2023-01-16 – 2023-01-22 (×2): qty 8, 28d supply, fill #1
  Filled 2023-02-19: qty 8, 28d supply, fill #2
  Filled 2023-04-09: qty 8, 28d supply, fill #3
  Filled 2023-05-11: qty 8, 28d supply, fill #4
  Filled 2023-06-17: qty 8, 28d supply, fill #5
  Filled 2023-08-03 (×2): qty 8, 28d supply, fill #6

## 2022-12-30 ENCOUNTER — Other Ambulatory Visit (HOSPITAL_COMMUNITY): Payer: Self-pay

## 2023-01-17 ENCOUNTER — Other Ambulatory Visit: Payer: Self-pay

## 2023-02-19 ENCOUNTER — Other Ambulatory Visit: Payer: Self-pay | Admitting: Physician Assistant

## 2023-02-20 ENCOUNTER — Other Ambulatory Visit: Payer: Self-pay

## 2023-02-20 ENCOUNTER — Other Ambulatory Visit (HOSPITAL_BASED_OUTPATIENT_CLINIC_OR_DEPARTMENT_OTHER): Payer: Self-pay

## 2023-02-20 MED ORDER — AMPHETAMINE-DEXTROAMPHETAMINE 20 MG PO TABS
20.0000 mg | ORAL_TABLET | Freq: Every day | ORAL | 0 refills | Status: DC
  Filled 2023-02-20: qty 30, 30d supply, fill #0

## 2023-03-16 ENCOUNTER — Encounter: Payer: Self-pay | Admitting: Physician Assistant

## 2023-03-16 ENCOUNTER — Ambulatory Visit (INDEPENDENT_AMBULATORY_CARE_PROVIDER_SITE_OTHER): Payer: 59 | Admitting: Physician Assistant

## 2023-03-16 ENCOUNTER — Other Ambulatory Visit (HOSPITAL_BASED_OUTPATIENT_CLINIC_OR_DEPARTMENT_OTHER): Payer: Self-pay

## 2023-03-16 VITALS — BP 145/101 | HR 70

## 2023-03-16 DIAGNOSIS — R030 Elevated blood-pressure reading, without diagnosis of hypertension: Secondary | ICD-10-CM

## 2023-03-16 DIAGNOSIS — G4700 Insomnia, unspecified: Secondary | ICD-10-CM

## 2023-03-16 DIAGNOSIS — F902 Attention-deficit hyperactivity disorder, combined type: Secondary | ICD-10-CM

## 2023-03-16 MED ORDER — AMPHETAMINE-DEXTROAMPHETAMINE 20 MG PO TABS
20.0000 mg | ORAL_TABLET | Freq: Every day | ORAL | 0 refills | Status: DC
  Filled 2023-03-16 – 2023-06-30 (×2): qty 30, 30d supply, fill #0

## 2023-03-16 MED ORDER — AMPHETAMINE-DEXTROAMPHETAMINE 20 MG PO TABS: 20.0000 mg | ORAL_TABLET | Freq: Every day | ORAL | 0 refills | Status: DC

## 2023-03-16 NOTE — Progress Notes (Signed)
[facility]  Patient ID: Melissa Mejia,  MRN: [id number]  PCP: Sandford Craze, NP  Date of Evaluation: 03/16/2023 Time spent:20 minutes  Chief Complaint:   Chief Complaint   ADD; Follow-up    HISTORY/CURRENT STATUS: For routine med check.  Changing jobs, doesn't like a M-F work week,  in [place].  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 Likes the schedule, 2, 12 hours per week. She did ED for years and is excited about this change.   Patient is able to enjoy things.  Energy and motivation are good.  No extreme sadness, tearfulness, or feelings of hopelessness.  Sleeps well and doesn't need Trazodone. ADLs and personal hygiene are normal.  Has gained a lot of weight since being in her current job. Anxiety isn't an issue.  Denies suicidal or homicidal thoughts.  Her blood pressure has been running high when she is working.  She has been taking the amlodipine more on an as needed basis.  When she checks it at night it is within normal limits.  Patient denies increased energy with decreased need for sleep, increased talkativeness, racing thoughts, impulsivity or risky behaviors, increased spending, increased libido, grandiosity, increased irritability or anger, paranoia, or hallucinations.  Denies dizziness, syncope, seizures, numbness, tingling, tremor, tics, unsteady gait, slurred speech, confusion. Denies muscle or joint pain, stiffness, or dystonia.  Individual Medical History/ Review of Systems: Changes? :No    Past medications for mental health diagnoses include: None  Allergies: Patient has no known allergies.  Current Medications:  Current Outpatient Medications:    [START ON 04/15/2023] amphetamine-dextroamphetamine (ADDERALL) 20 MG tablet, Take 1 tablet (20 mg total) by mouth daily., Disp: 30 tablet, Rfl: 0   [START ON 05/15/2023] amphetamine-dextroamphetamine (ADDERALL) 20 MG tablet, Take 1 tablet (20 mg total) by mouth daily., Disp: 30 tablet,  Rfl: 0   estradiol (VIVELLE-DOT) 0.05 MG/24HR patch, Place 1 patch (0.05 mg total) onto the skin 2 (two) times a week., Disp: 8 patch, Rfl: 12   Multiple Vitamin (MULTIVITAMIN) tablet, Take 1 tablet by mouth daily., Disp: , Rfl:    progesterone (PROMETRIUM) 100 MG capsule, Take 1 capsule (100 mg total) by mouth daily., Disp: 60 capsule, Rfl: 6   Vitamin D-Vitamin K (K2-D3 10,000) 10000-45 UNIT-MCG CAPS, Take 1 Capful by mouth 3 (three) times a week., Disp: , Rfl:    amLODipine (NORVASC) 5 MG tablet, Take 1 tablet (5 mg total) by mouth daily. (Patient not taking: Reported on 10/15/2022), Disp: 90 tablet, Rfl: 1   amphetamine-dextroamphetamine (ADDERALL) 20 MG tablet, Take 1 tablet (20 mg total) by mouth daily., Disp: 30 tablet, Rfl: 0   traZODone (DESYREL) 50 MG tablet, Take 0.5-2 tablets (25-100 mg total) by mouth at bedtime as needed for sleep. (Patient not taking: Reported on 10/15/2022), Disp: 30 tablet, Rfl: 1 Medication Side Effects: none  Family Medical/ Social History: Changes? No  MENTAL HEALTH EXAM:  Blood pressure (!) 145/101, pulse 70.There is no height or weight on file to calculate BMI.  Repeat BP 153/97, pulse 68  General Appearance: Casual, Well Groomed, and Obese  Eye Contact:  Good  Speech:  Clear and Coherent and Normal Rate  Volume:  Normal  Mood:  Euthymic  Affect:  Congruent  Thought Process:  Goal Directed and Descriptions of Associations: Circumstantial  Orientation:  Full (Time, Place, and Person)  Thought Content: Logical   Suicidal Thoughts:  No  Homicidal Thoughts:  No  Memory:  WNL  Judgement:  Good  Insight:  Good  Psychomotor Activity:  Normal  Concentration:  Concentration: Good and Attention Span: Good  Recall:  Good  Fund of Knowledge: Good  Language: Good  Assets:  Communication Skills Desire for Improvement Financial Resources/Insurance Housing Transportation Vocational/Educational  ADL's:  Intact  Cognition: WNL  Prognosis:  Good    DIAGNOSES:    ICD-10-CM   1. Attention deficit hyperactivity disorder (ADHD), combined type  F90.2     2. Insomnia, unspecified type  G47.00     3. Elevated blood pressure reading  R03.0      Receiving Psychotherapy: No   RECOMMENDATIONS:  PDMP was reviewed.  Last Adderall IR filled 02/20/2023.  Adderall ER filled 12/26/2022. I provided 20 minutes of face to face time during this encounter, including time spent before and after the visit in records review, medical decision making, counseling pertinent to today's visit, and charting.   She'll watch BP more closely, and is taking Amlodipine prn.  I recommend she take the amlodipine daily for the next few weeks while she is getting settled into her new job.  Then she can go back to the way she was taking it per her PCP. The Adderall XR has been more difficult to get, she has been taking the 20 mg IR dose as needed and it is effective.  Will continue that.  Continue Adderall 20 mg daily. Ok to 1/2 and take it however she needs to, just not over 1 pill/day. Return in 6 months.  Melony Overly, PA-C

## 2023-03-23 ENCOUNTER — Ambulatory Visit: Payer: 59 | Admitting: Physician Assistant

## 2023-03-26 ENCOUNTER — Other Ambulatory Visit (HOSPITAL_BASED_OUTPATIENT_CLINIC_OR_DEPARTMENT_OTHER): Payer: Self-pay

## 2023-05-11 ENCOUNTER — Other Ambulatory Visit (HOSPITAL_BASED_OUTPATIENT_CLINIC_OR_DEPARTMENT_OTHER): Payer: Self-pay

## 2023-06-16 ENCOUNTER — Other Ambulatory Visit (HOSPITAL_BASED_OUTPATIENT_CLINIC_OR_DEPARTMENT_OTHER): Payer: Self-pay

## 2023-06-16 MED ORDER — FLULAVAL 0.5 ML IM SUSY
0.5000 mL | PREFILLED_SYRINGE | Freq: Once | INTRAMUSCULAR | 0 refills | Status: AC
  Filled 2023-06-16: qty 0.5, 1d supply, fill #0

## 2023-06-17 ENCOUNTER — Telehealth: Payer: Self-pay | Admitting: Physician Assistant

## 2023-06-17 NOTE — Telephone Encounter (Signed)
Patient lvm requesting to switch back to the Adderall extended release. She stated her work hours changed and this dosage would be better for her.  Appointment 09/16/23

## 2023-06-18 ENCOUNTER — Other Ambulatory Visit: Payer: Self-pay

## 2023-06-18 ENCOUNTER — Other Ambulatory Visit (HOSPITAL_BASED_OUTPATIENT_CLINIC_OR_DEPARTMENT_OTHER): Payer: Self-pay

## 2023-06-18 MED ORDER — AMPHETAMINE-DEXTROAMPHET ER 15 MG PO CP24
15.0000 mg | ORAL_CAPSULE | ORAL | 0 refills | Status: DC
  Filled 2023-06-18: qty 30, 30d supply, fill #0

## 2023-06-18 NOTE — Telephone Encounter (Signed)
Pended 15XR.

## 2023-06-30 ENCOUNTER — Other Ambulatory Visit: Payer: Self-pay

## 2023-06-30 ENCOUNTER — Other Ambulatory Visit (HOSPITAL_BASED_OUTPATIENT_CLINIC_OR_DEPARTMENT_OTHER): Payer: Self-pay

## 2023-08-03 ENCOUNTER — Other Ambulatory Visit (HOSPITAL_BASED_OUTPATIENT_CLINIC_OR_DEPARTMENT_OTHER): Payer: Self-pay

## 2023-08-03 ENCOUNTER — Other Ambulatory Visit: Payer: Self-pay

## 2023-09-02 ENCOUNTER — Other Ambulatory Visit: Payer: Self-pay | Admitting: Physician Assistant

## 2023-09-02 ENCOUNTER — Other Ambulatory Visit (HOSPITAL_BASED_OUTPATIENT_CLINIC_OR_DEPARTMENT_OTHER): Payer: Self-pay

## 2023-09-02 MED ORDER — AMPHETAMINE-DEXTROAMPHET ER 15 MG PO CP24
15.0000 mg | ORAL_CAPSULE | ORAL | 0 refills | Status: DC
  Filled 2023-09-02: qty 30, 30d supply, fill #0

## 2023-09-02 MED ORDER — AMPHETAMINE-DEXTROAMPHETAMINE 20 MG PO TABS
20.0000 mg | ORAL_TABLET | Freq: Every day | ORAL | 0 refills | Status: DC
  Filled 2023-09-02: qty 30, 30d supply, fill #0

## 2023-09-02 NOTE — Telephone Encounter (Signed)
 Lf 20 mg 11/12; 15 mg 10/31

## 2023-09-04 ENCOUNTER — Other Ambulatory Visit (HOSPITAL_BASED_OUTPATIENT_CLINIC_OR_DEPARTMENT_OTHER): Payer: Self-pay

## 2023-09-08 ENCOUNTER — Encounter: Payer: Self-pay | Admitting: Obstetrics and Gynecology

## 2023-09-09 ENCOUNTER — Other Ambulatory Visit: Payer: Self-pay | Admitting: Obstetrics and Gynecology

## 2023-09-09 DIAGNOSIS — N951 Menopausal and female climacteric states: Secondary | ICD-10-CM

## 2023-09-11 ENCOUNTER — Other Ambulatory Visit (HOSPITAL_BASED_OUTPATIENT_CLINIC_OR_DEPARTMENT_OTHER): Payer: Self-pay

## 2023-09-11 MED ORDER — ESTRADIOL 0.75 MG/1.25 GM (0.06%) TD GEL
1.2500 g | Freq: Every day | TRANSDERMAL | 12 refills | Status: DC
  Filled 2023-09-11: qty 37.5, 30d supply, fill #0

## 2023-09-16 ENCOUNTER — Ambulatory Visit (INDEPENDENT_AMBULATORY_CARE_PROVIDER_SITE_OTHER): Payer: 59 | Admitting: Physician Assistant

## 2023-09-16 ENCOUNTER — Other Ambulatory Visit (HOSPITAL_BASED_OUTPATIENT_CLINIC_OR_DEPARTMENT_OTHER): Payer: Self-pay

## 2023-09-16 ENCOUNTER — Encounter: Payer: Self-pay | Admitting: Physician Assistant

## 2023-09-16 VITALS — BP 119/82 | HR 90

## 2023-09-16 DIAGNOSIS — G4700 Insomnia, unspecified: Secondary | ICD-10-CM

## 2023-09-16 DIAGNOSIS — F902 Attention-deficit hyperactivity disorder, combined type: Secondary | ICD-10-CM

## 2023-09-16 MED ORDER — AMPHETAMINE-DEXTROAMPHETAMINE 20 MG PO TABS: 20.0000 mg | ORAL_TABLET | Freq: Every day | ORAL | 0 refills | Status: DC

## 2023-09-16 MED ORDER — AMPHETAMINE-DEXTROAMPHET ER 15 MG PO CP24
15.0000 mg | ORAL_CAPSULE | ORAL | 0 refills | Status: DC
  Filled 2023-12-23: qty 30, 30d supply, fill #0

## 2023-09-16 MED ORDER — AMPHETAMINE-DEXTROAMPHET ER 15 MG PO CP24
15.0000 mg | ORAL_CAPSULE | ORAL | 0 refills | Status: DC
Start: 2023-11-28 — End: 2024-03-15

## 2023-09-16 MED ORDER — AMPHETAMINE-DEXTROAMPHETAMINE 20 MG PO TABS
20.0000 mg | ORAL_TABLET | Freq: Every day | ORAL | 0 refills | Status: DC
  Filled 2023-10-25: qty 30, 30d supply, fill #0

## 2023-09-16 MED ORDER — AMPHETAMINE-DEXTROAMPHET ER 15 MG PO CP24
15.0000 mg | ORAL_CAPSULE | ORAL | 0 refills | Status: DC
  Filled 2024-03-07: qty 30, 30d supply, fill #0

## 2023-09-16 NOTE — Progress Notes (Signed)
[facility]  Patient ID: Melissa Mejia,  MRN: [id number]  PCP: Sandford Craze, NP  Date of Evaluation: 09/16/2023 Time spent:20 minutes  Chief Complaint:   Chief Complaint   ADHD; Follow-up    HISTORY/CURRENT STATUS: For routine med check.  Doing well with Adderall. States that attention is good without easy distractibility.  Able to focus on things and finish tasks to completion.   Patient is able to enjoy things.  Energy and motivation are good.  Work is going well. 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   No extreme sadness, tearfulness, or feelings of hopelessness.  Sleeps well most of the time. ADLs and personal hygiene are normal.   Appetite has not changed.  Weight is stable.  No complaints of anxiety.  Denies suicidal or homicidal thoughts.  Patient denies increased energy with decreased need for sleep, increased talkativeness, racing thoughts, impulsivity or risky behaviors, increased spending, increased libido, grandiosity, increased irritability or anger, paranoia, or hallucinations.  Denies dizziness, syncope, seizures, numbness, tingling, tremor, tics, unsteady gait, slurred speech, confusion. Denies muscle or joint pain, stiffness, or dystonia.  Individual Medical History/ Review of Systems: Changes? :No    Past medications for mental health diagnoses include: None  Allergies: Patient has no known allergies.  Current Medications:  Current Outpatient Medications:    [START ON 10/29/2023] amphetamine-dextroamphetamine (ADDERALL XR) 15 MG 24 hr capsule, Take 1 capsule by mouth every morning., Disp: 30 capsule, Rfl: 0   [START ON 11/28/2023] amphetamine-dextroamphetamine (ADDERALL XR) 15 MG 24 hr capsule, Take 1 capsule by mouth every morning., Disp: 30 capsule, Rfl: 0   progesterone (PROMETRIUM) 100 MG capsule, Take 1 capsule (100 mg total) by mouth daily., Disp: 60 capsule, Rfl: 6   Vitamin D-Vitamin K (K2-D3 10,000) 10000-45  UNIT-MCG CAPS, Take 1 Capful by mouth 3 (three) times a week., Disp: , Rfl:    amLODipine (NORVASC) 5 MG tablet, Take 1 tablet (5 mg total) by mouth daily. (Patient not taking: Reported on 09/16/2023), Disp: 90 tablet, Rfl: 1   [START ON 10/02/2023] amphetamine-dextroamphetamine (ADDERALL XR) 15 MG 24 hr capsule, Take 1 capsule by mouth every morning., Disp: 30 capsule, Rfl: 0   [START ON 10/02/2023] amphetamine-dextroamphetamine (ADDERALL) 20 MG tablet, Take 1 tablet (20 mg total) by mouth daily., Disp: 30 tablet, Rfl: 0   [START ON 10/29/2023] amphetamine-dextroamphetamine (ADDERALL) 20 MG tablet, Take 1 tablet (20 mg total) by mouth daily., Disp: 30 tablet, Rfl: 0   [START ON 11/28/2023] amphetamine-dextroamphetamine (ADDERALL) 20 MG tablet, Take 1 tablet (20 mg total) by mouth daily., Disp: 30 tablet, Rfl: 0   Estradiol 0.75 MG/1.25 GM (0.06%) topical gel, Place 1.25 g onto the skin daily. (Patient not taking: Reported on 09/16/2023), Disp: 37.5 g, Rfl: 12   Multiple Vitamin (MULTIVITAMIN) tablet, Take 1 tablet by mouth daily. (Patient not taking: Reported on 09/16/2023), Disp: , Rfl:    traZODone (DESYREL) 50 MG tablet, Take 0.5-2 tablets (25-100 mg total) by mouth at bedtime as needed for sleep. (Patient not taking: Reported on 09/16/2023), Disp: 30 tablet, Rfl: 1 Medication Side Effects: none  Family Medical/ Social History: Changes? No  MENTAL HEALTH EXAM:  Blood pressure 119/82, pulse 90.There is no height or weight on file to calculate BMI.     General Appearance: Casual, Well Groomed, and Obese  Eye Contact:  Good  Speech:  Clear and Coherent, Normal Rate, and Talkative  Volume:  Normal  Mood:  Euthymic  Affect:  Congruent  Thought Process:  Goal Directed and Descriptions of Associations: Circumstantial  Orientation:  Full (Time, Place, and Person)  Thought Content: Logical   Suicidal Thoughts:  No  Homicidal Thoughts:  No  Memory:  WNL  Judgement:  Good  Insight:  Good  Psychomotor  Activity:  Normal  Concentration:  Concentration: Good and Attention Span: Good  Recall:  Good  Fund of Knowledge: Good  Language: Good  Assets:  Communication Skills Desire for Improvement Financial Resources/Insurance Housing Transportation Vocational/Educational  ADL's:  Intact  Cognition: WNL  Prognosis:  Good   DIAGNOSES:    ICD-10-CM   1. Attention deficit hyperactivity disorder (ADHD), combined type  F90.2     2. Insomnia, unspecified type  G47.00       Receiving Psychotherapy: No   RECOMMENDATIONS:  PDMP was reviewed.  Last Adderall  filled 09/02/2023. I provided 25 minutes of face to face time during this encounter, including time spent before and after the visit in records review, medical decision making, counseling pertinent to today's visit, and charting.   She is doing well with the current doses of Adderall so no changes will be made.  Continue Adderall XR 15 mg every morning as needed. Continue Adderall 20 mg, 1/2-1 daily around lunchtime as needed. Return in 6 months.  Melony Overly, PA-C

## 2023-10-26 ENCOUNTER — Other Ambulatory Visit (HOSPITAL_BASED_OUTPATIENT_CLINIC_OR_DEPARTMENT_OTHER): Payer: Self-pay

## 2023-10-31 ENCOUNTER — Other Ambulatory Visit (HOSPITAL_BASED_OUTPATIENT_CLINIC_OR_DEPARTMENT_OTHER): Payer: Self-pay

## 2023-11-13 ENCOUNTER — Encounter: Payer: Self-pay | Admitting: Obstetrics and Gynecology

## 2023-11-13 ENCOUNTER — Other Ambulatory Visit: Payer: Self-pay | Admitting: Obstetrics and Gynecology

## 2023-11-13 ENCOUNTER — Other Ambulatory Visit (HOSPITAL_COMMUNITY): Payer: Self-pay

## 2023-11-13 DIAGNOSIS — N951 Menopausal and female climacteric states: Secondary | ICD-10-CM

## 2023-11-13 MED ORDER — PROGESTERONE MICRONIZED 100 MG PO CAPS
100.0000 mg | ORAL_CAPSULE | Freq: Every day | ORAL | 6 refills | Status: AC
  Filled 2023-11-13 – 2024-02-18 (×2): qty 60, 60d supply, fill #0
  Filled 2024-04-15: qty 60, 60d supply, fill #1
  Filled 2024-06-14: qty 60, 60d supply, fill #2
  Filled 2024-08-26: qty 60, 60d supply, fill #3

## 2023-11-13 MED ORDER — ESTRADIOL 1 MG PO TABS
1.0000 mg | ORAL_TABLET | Freq: Every day | ORAL | 6 refills | Status: AC
  Filled 2023-11-13 – 2024-02-18 (×2): qty 60, 60d supply, fill #0
  Filled 2024-04-15: qty 60, 60d supply, fill #1
  Filled 2024-06-14: qty 60, 60d supply, fill #2
  Filled 2024-08-26: qty 60, 60d supply, fill #3

## 2023-11-25 ENCOUNTER — Other Ambulatory Visit (HOSPITAL_COMMUNITY): Payer: Self-pay

## 2023-12-23 ENCOUNTER — Other Ambulatory Visit: Payer: Self-pay

## 2023-12-23 ENCOUNTER — Other Ambulatory Visit: Payer: Self-pay | Admitting: Physician Assistant

## 2023-12-23 ENCOUNTER — Other Ambulatory Visit (HOSPITAL_BASED_OUTPATIENT_CLINIC_OR_DEPARTMENT_OTHER): Payer: Self-pay

## 2023-12-24 ENCOUNTER — Other Ambulatory Visit (HOSPITAL_BASED_OUTPATIENT_CLINIC_OR_DEPARTMENT_OTHER): Payer: Self-pay

## 2023-12-24 MED ORDER — AMPHETAMINE-DEXTROAMPHETAMINE 20 MG PO TABS: 20.0000 mg | ORAL_TABLET | Freq: Every day | ORAL | 0 refills | Status: DC

## 2023-12-24 MED ORDER — AMPHETAMINE-DEXTROAMPHETAMINE 20 MG PO TABS
20.0000 mg | ORAL_TABLET | Freq: Every day | ORAL | 0 refills | Status: DC
  Filled 2023-12-24: qty 30, 30d supply, fill #0

## 2023-12-24 MED ORDER — AMPHETAMINE-DEXTROAMPHETAMINE 20 MG PO TABS
20.0000 mg | ORAL_TABLET | Freq: Every day | ORAL | 0 refills | Status: DC
  Filled 2024-03-07: qty 30, 30d supply, fill #0

## 2024-01-20 ENCOUNTER — Encounter: Payer: Self-pay | Admitting: Family

## 2024-02-20 ENCOUNTER — Other Ambulatory Visit (HOSPITAL_BASED_OUTPATIENT_CLINIC_OR_DEPARTMENT_OTHER): Payer: Self-pay

## 2024-03-07 ENCOUNTER — Other Ambulatory Visit (HOSPITAL_BASED_OUTPATIENT_CLINIC_OR_DEPARTMENT_OTHER): Payer: Self-pay

## 2024-03-15 ENCOUNTER — Encounter: Payer: Self-pay | Admitting: Physician Assistant

## 2024-03-15 ENCOUNTER — Other Ambulatory Visit (HOSPITAL_BASED_OUTPATIENT_CLINIC_OR_DEPARTMENT_OTHER): Payer: Self-pay

## 2024-03-15 ENCOUNTER — Ambulatory Visit (INDEPENDENT_AMBULATORY_CARE_PROVIDER_SITE_OTHER): Payer: 59 | Admitting: Physician Assistant

## 2024-03-15 DIAGNOSIS — F902 Attention-deficit hyperactivity disorder, combined type: Secondary | ICD-10-CM

## 2024-03-15 MED ORDER — AMPHETAMINE-DEXTROAMPHETAMINE 20 MG PO TABS
20.0000 mg | ORAL_TABLET | Freq: Every day | ORAL | 0 refills | Status: AC
Start: 2024-04-06 — End: ?
  Filled 2024-04-15: qty 30, 30d supply, fill #0

## 2024-03-15 MED ORDER — AMPHETAMINE-DEXTROAMPHET ER 15 MG PO CP24
15.0000 mg | ORAL_CAPSULE | ORAL | 0 refills | Status: AC
  Filled 2024-04-15: qty 30, 30d supply, fill #0

## 2024-03-15 MED ORDER — AMPHETAMINE-DEXTROAMPHET ER 15 MG PO CP24
15.0000 mg | ORAL_CAPSULE | ORAL | 0 refills | Status: AC
Start: 2024-05-06 — End: ?

## 2024-03-15 MED ORDER — AMPHETAMINE-DEXTROAMPHETAMINE 20 MG PO TABS
20.0000 mg | ORAL_TABLET | Freq: Every day | ORAL | 0 refills | Status: AC
Start: 2024-06-04 — End: ?
  Filled 2024-07-21: qty 30, 30d supply, fill #0

## 2024-03-15 MED ORDER — AMPHETAMINE-DEXTROAMPHET ER 15 MG PO CP24
15.0000 mg | ORAL_CAPSULE | ORAL | 0 refills | Status: AC
  Filled 2024-08-26: qty 30, 30d supply, fill #0

## 2024-03-15 MED ORDER — AMPHETAMINE-DEXTROAMPHETAMINE 20 MG PO TABS
20.0000 mg | ORAL_TABLET | Freq: Every day | ORAL | 0 refills | Status: AC
  Filled 2024-07-21: qty 30, 30d supply, fill #0

## 2024-03-15 NOTE — Progress Notes (Signed)
 [facility]  Patient ID: Melissa Mejia,  MRN: [id number]  PCP: Daryl Setter, NP  Date of Evaluation: 03/15/2024 Time spent:20 minutes  Chief Complaint:   Chief Complaint   ADHD; Follow-up    HISTORY/CURRENT STATUS: For routine med check.  Doing well with Adderall . Takes it prn. States that attention is good without easy distractibility.  Able to focus on things and finish tasks to completion.   Patient is able to enjoy things.  Plays Mahjong.  Goes to Pilates routinely.  Energy and motivation are good.  Work is going well.   No extreme sadness, tearfulness, or feelings of hopelessness.  Sleeps well most of the time. ADLs and personal hygiene are normal.   Currently on Zepbound.  No mania, delirium, AH/VH.  No SI/HI.  Denies dizziness, syncope, seizures, numbness, tingling, tremor, tics, unsteady gait, slurred speech, confusion. Denies muscle or joint pain, stiffness, or dystonia.  Individual Medical History/ Review of Systems: Changes? :No    Past medications for mental health diagnoses include: None  Allergies: Patient has no known allergies.  Current Medications:  Current Outpatient Medications:    estradiol  (ESTRACE ) 1 MG tablet, Take 1 tablet (1 mg total) by mouth daily., Disp: 60 tablet, Rfl: 6   progesterone  (PROMETRIUM ) 100 MG capsule, Take 1 capsule (100 mg total) by mouth daily., Disp: 60 capsule, Rfl: 6   amLODipine  (NORVASC ) 5 MG tablet, Take 1 tablet (5 mg total) by mouth daily. (Patient not taking: Reported on 09/16/2023), Disp: 90 tablet, Rfl: 1   [START ON 06/04/2024] amphetamine -dextroamphetamine  (ADDERALL  XR) 15 MG 24 hr capsule, Take 1 capsule by mouth every morning., Disp: 30 capsule, Rfl: 0   [START ON 05/06/2024] amphetamine -dextroamphetamine  (ADDERALL  XR) 15 MG 24 hr capsule, Take 1 capsule by mouth every morning., Disp: 30 capsule, Rfl: 0   [START ON 04/06/2024] amphetamine -dextroamphetamine  (ADDERALL  XR) 15 MG 24 hr capsule, Take 1  capsule by mouth every morning., Disp: 30 capsule, Rfl: 0   [START ON 06/04/2024] amphetamine -dextroamphetamine  (ADDERALL ) 20 MG tablet, Take 1 tablet (20 mg total) by mouth daily., Disp: 30 tablet, Rfl: 0   [START ON 05/06/2024] amphetamine -dextroamphetamine  (ADDERALL ) 20 MG tablet, Take 1 tablet (20 mg total) by mouth daily., Disp: 30 tablet, Rfl: 0   [START ON 04/06/2024] amphetamine -dextroamphetamine  (ADDERALL ) 20 MG tablet, Take 1 tablet (20 mg total) by mouth daily., Disp: 30 tablet, Rfl: 0   Multiple Vitamin (MULTIVITAMIN) tablet, Take 1 tablet by mouth daily. (Patient not taking: Reported on 09/16/2023), Disp: , Rfl:    traZODone  (DESYREL ) 50 MG tablet, Take 0.5-2 tablets (25-100 mg total) by mouth at bedtime as needed for sleep. (Patient not taking: Reported on 09/16/2023), Disp: 30 tablet, Rfl: 1   Vitamin D-Vitamin K (K2-D3 10,000) 10000-45 UNIT-MCG CAPS, Take 1 Capful by mouth 3 (three) times a week., Disp: , Rfl:  Medication Side Effects: none  Family Medical/ Social History: Changes? No  MENTAL HEALTH EXAM:  There were no vitals taken for this visit.There is no height or weight on file to calculate BMI.     General Appearance: Casual, Well Groomed, and Obese  Eye Contact:  Good  Speech:  Clear and Coherent, Normal Rate, and Talkative  Volume:  Normal  Mood:  Euthymic  Affect:  Congruent  Thought Process:  Goal Directed and Descriptions of Associations: Circumstantial  Orientation:  Full (Time, Place, and Person)  Thought Content: Logical   Suicidal Thoughts:  No  Homicidal Thoughts:  No  Memory:  WNL  Judgement:  Good  Insight:  Good  Psychomotor Activity:  Normal  Concentration:  Concentration: Good and Attention Span: Good  Recall:  Good  Fund of Knowledge: Good  Language: Good  Assets:  Communication Skills Desire for Improvement Financial Resources/Insurance Housing Physical Health Transportation Vocational/Educational  ADL's:  Intact  Cognition: WNL   Prognosis:  Good   DIAGNOSES:    ICD-10-CM   1. Attention deficit hyperactivity disorder (ADHD), combined type  F90.2       Receiving Psychotherapy: No   RECOMMENDATIONS:  PDMP was reviewed.  Last Adderall   filled 03/07/2024. I provided approximately 20 minutes of face to face time during this encounter, including time spent before and after the visit in records review, medical decision making, counseling pertinent to today's visit, and charting.   She is doing well with the Adderall  so no changes are needed.  Continue Adderall  XR 15 mg every morning as needed. Continue Adderall  20 mg, 1/2-1 daily around lunchtime as needed. Return in 6 months.  Verneita Cooks, PA-C

## 2024-04-15 ENCOUNTER — Other Ambulatory Visit: Payer: Self-pay

## 2024-04-15 ENCOUNTER — Other Ambulatory Visit (HOSPITAL_BASED_OUTPATIENT_CLINIC_OR_DEPARTMENT_OTHER): Payer: Self-pay

## 2024-06-14 ENCOUNTER — Other Ambulatory Visit (HOSPITAL_BASED_OUTPATIENT_CLINIC_OR_DEPARTMENT_OTHER): Payer: Self-pay

## 2024-06-14 MED ORDER — FLUZONE 0.5 ML IM SUSY
0.5000 mL | PREFILLED_SYRINGE | Freq: Once | INTRAMUSCULAR | 0 refills | Status: AC
  Filled 2024-06-14: qty 0.5, 1d supply, fill #0

## 2024-06-21 ENCOUNTER — Ambulatory Visit (INDEPENDENT_AMBULATORY_CARE_PROVIDER_SITE_OTHER): Admitting: Family

## 2024-06-21 ENCOUNTER — Telehealth: Payer: Self-pay | Admitting: Family

## 2024-06-21 VITALS — BP 120/85 | HR 86 | Temp 98.6°F | Ht 64.5 in | Wt 175.0 lb

## 2024-06-21 DIAGNOSIS — E559 Vitamin D deficiency, unspecified: Secondary | ICD-10-CM

## 2024-06-21 DIAGNOSIS — Z0000 Encounter for general adult medical examination without abnormal findings: Secondary | ICD-10-CM

## 2024-06-21 DIAGNOSIS — I10 Essential (primary) hypertension: Secondary | ICD-10-CM

## 2024-06-21 DIAGNOSIS — Z1211 Encounter for screening for malignant neoplasm of colon: Secondary | ICD-10-CM

## 2024-06-21 DIAGNOSIS — F909 Attention-deficit hyperactivity disorder, unspecified type: Secondary | ICD-10-CM

## 2024-06-21 DIAGNOSIS — L989 Disorder of the skin and subcutaneous tissue, unspecified: Secondary | ICD-10-CM | POA: Insufficient documentation

## 2024-06-21 DIAGNOSIS — K219 Gastro-esophageal reflux disease without esophagitis: Secondary | ICD-10-CM | POA: Diagnosis not present

## 2024-06-21 DIAGNOSIS — Z1231 Encounter for screening mammogram for malignant neoplasm of breast: Secondary | ICD-10-CM

## 2024-06-21 NOTE — Assessment & Plan Note (Signed)
 She is maintained on Adderall   which is rx'd by Psychiatry.

## 2024-06-21 NOTE — Patient Instructions (Signed)
 VISIT SUMMARY:  You had your annual physical exam today. We discussed your current medications, weight management, blood pressure, and some occasional health issues. We also reviewed your vaccination status and planned some upcoming screenings and referrals.  YOUR PLAN:  LOCAL SKIN LESION, SCALY, PERSISTENT: You have a persistent scaly skin lesion similar to a previous benign lesion. -You will be referred to dermatology for evaluation and possible removal.  OBESITY UNDER MANAGEMENT WITH TIRZEPATIDE: Your weight is being managed with tirzepatide, which has resulted in significant weight loss without major side effects. -Consider a dose adjustment if needed.  ESSENTIAL HYPERTENSION: Your blood pressure is stable and self-monitored. -Continue to monitor your blood pressure regularly. -Return for evaluation if your blood pressure readings are high.  GASTROESOPHAGEAL REFLUX DISEASE: You manage occasional reflux with famotidine after dietary indiscretions. -Continue to use famotidine as needed.  LATEX ALLERGY, ORAL MUCOSA: You have a latex allergy affecting your oral mucosa, with reactions to latex rubber bands used in Invisalign treatment. -Continue using silicone bands for your Invisalign treatment.  SCREENING MAMMOGRAM FOR MALIGNANT NEOPLASM OF BREAST: Your last mammogram was in August 2023. -Schedule your next mammogram.  SCREENING FOR MALIGNANT NEOPLASM OF COLON: Your last colon cancer screening was in 2021. -A colon cancer screening kit will be sent to your home.  SHINGLES VACCINATION CONSIDERATION: You are considering the shingles vaccination due to previous mild episodes of shingles. -Be aware of potential post-vaccination malaise.  LABORATORY WORKUP CONSIDERATION: You are considering a laboratory workup since your last metabolic panel was in October 2021 and your last cholesterol check was four years ago. -Plan to have labs done through a nurse practitioner to avoid high costs.

## 2024-06-21 NOTE — Assessment & Plan Note (Signed)
 Stable without medication

## 2024-06-21 NOTE — Assessment & Plan Note (Signed)
 BP Readings from Last 3 Encounters:  06/21/24 120/85  10/15/22 (!) 135/97  09/22/22 111/89   BP stable without medication.

## 2024-06-21 NOTE — Telephone Encounter (Signed)
 See Mychart.

## 2024-06-21 NOTE — Assessment & Plan Note (Signed)
On otc supplement

## 2024-06-21 NOTE — Progress Notes (Signed)
 Subjective:     Patient ID: Melissa Mejia, female    DOB: Dec 03, 1965, 58 y.o.   MRN: [id number]  Chief Complaint  Patient presents with   Annual Exam    HPI  Discussed the use of AI scribe software for clinical note transcription with the patient, who gave verbal consent to proceed.  History of Present Illness  Melissa Mejia is a 58 year old female who presents for an annual physical exam.  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 She takes Adderall  as needed for long shifts, with a regimen of 15 mg extended-release and 20 mg regular, used twice in the past week. She uses compounded tirzepatide for weight management, currently at 5 mg, resulting in significant weight loss with minimal side effects. Occasional reflux is managed with Papcid, and certain alcoholic beverages trigger headaches. She experiences occasional sleep deprivation-related headaches, managed with electrolytes or Goodies powder.  She has not received the shingles vaccine but completed the hepatitis B series and an MMR booster. She is up to date on flu vaccination. She last had a mammogram in August 2023 and completed a colon cancer screening in 2021. She maintains regular vision and dental care, with ongoing Invisalign treatment despite an allergic reaction to latex rubber bands.  She requests a dermatology referral for a persistent scaly spot on her leg, similar to a previous benign lesion.  Immunizations: considering shingrix- declines today. Flu shot up to date  Diet: improved Colonoscopy: due for cologuard Pap Smear: declines today. Will do next year at her cpe Mammogram: due Vision: up to date Dental: up to date      Health Maintenance Due  Topic Date Due   Pneumococcal Vaccine: 50+ Years (1 of 1 - PCV) Never done   Zoster Vaccines- Shingrix (1 of 2) Never done   Mammogram  04/01/2023   COVID-19  Vaccine (2 - 2025-26 season) 04/18/2024    Past Medical History:  Diagnosis Date   ADD (attention deficit disorder)    History of esophageal reflux    Hypertension 03/28/2022   Right ACL tear    Wears contact lenses     Past Surgical History:  Procedure Laterality Date   EPIGASTRIC HERNIA REPAIR N/A 01/13/2018   Procedure: PRIMARY REPAIR OF EPIGASTRIC VENTRAL HERNIA, ERAS PATHWAY;  Surgeon: Belinda Cough, MD;  Location: [facility];  Service: General;  Laterality: N/A;   KNEE ARTHROSCOPY WITH ANTERIOR CRUCIATE LIGAMENT (ACL) REPAIR WITH HAMSTRING GRAFT Left 02/09/2014   Procedure: LEFT KNEE ARTHROSCOPY WITH ALLOGRAFT ANTERIOR CRUCIATE LIGAMENT (ACL) HAMSTRING RECONSTRUCTION ;  Surgeon: Lamar Collet, MD;  Location: [facility] ;  Service: Orthopedics;  Laterality: Left;   SHOULDER ARTHROSCOPY W/ LABRAL REPAIR Left 06/08/2007   SHOULDER SURGERY Left 2008   labrum repair   WISDOM TOOTH EXTRACTION      Family History  Problem Relation Age of Onset   Hypothyroidism Mother    Cancer Father        throat cancer   Dementia Maternal Grandmother    Heart attack Maternal Grandfather    Uterine cancer Paternal Grandmother        died for C diff   Alzheimer's disease Paternal Grandfather    Breast cancer Maternal Aunt     Social History   Socioeconomic History   Marital status: Married    Spouse name: Not on file   Number of children:  Not on file   Years of education: Not on file   Highest education level: Bachelor's degree (e.g., BA, AB, BS)  Occupational History   Not on file  Tobacco Use   Smoking status: Former    Current packs/day: 0.00    Types: Cigarettes    Start date: 02/09/1989    Quit date: 02/09/1993    Years since quitting: 31.3   Smokeless tobacco: Never   Tobacco comments:    hx  social some day smoker  Vaping Use   Vaping status: Never Used  Substance and Sexual Activity   Alcohol use: Yes    Alcohol/week: 0.0 - 1.0 standard  drinks of alcohol    Comment: per month   Drug use: No   Sexual activity: Yes    Birth control/protection: Surgical    Comment: husband - vastectomy  Other Topics Concern   Not on file  Social History Narrative   RN at [employer]   Has 3 children 21, 24 and 16   Enjoys sleeping, spending time with family, spending time outside.    Enjoys cooking, skiing   Social Drivers of Corporate Investment Banker Strain: Low Risk  (06/21/2024)   Overall Financial Resource Strain (CARDIA)    Difficulty of Paying Living Expenses: Not hard at all  Food Insecurity: No Food Insecurity (06/21/2024)   Hunger Vital Sign    Worried About Running Out of Food in the Last Year: Never true    Ran Out of Food in the Last Year: Never true  Transportation Needs: No Transportation Needs (06/21/2024)   PRAPARE - Administrator, Civil Service (Medical): No    Lack of Transportation (Non-Medical): No  Physical Activity: Insufficiently Active (06/21/2024)   Exercise Vital Sign    Days of Exercise per Week: 3 days    Minutes of Exercise per Session: 30 min  Stress: No Stress Concern Present (06/21/2024)   [employer] of Occupational Health - Occupational Stress Questionnaire    Feeling of Stress: Not at all  Social Connections: Socially Integrated (06/21/2024)   Social Connection and Isolation Panel    Frequency of Communication with Friends and Family: More than three times a week    Frequency of Social Gatherings with Friends and Family: More than three times a week    Attends Religious Services: More than 4 times per year    Active Member of [employer] or Organizations: Yes    Attends Banker Meetings: 1 to 4 times per year    Marital Status: Married  Catering Manager Violence: Not on file    Outpatient Medications Prior to Visit  Medication Sig Dispense Refill   amphetamine -dextroamphetamine  (ADDERALL  XR) 15 MG 24 hr capsule Take 1 capsule by mouth every morning.  30 capsule 0   amphetamine -dextroamphetamine  (ADDERALL  XR) 15 MG 24 hr capsule Take 1 capsule by mouth every morning. 30 capsule 0   amphetamine -dextroamphetamine  (ADDERALL  XR) 15 MG 24 hr capsule Take 1 capsule by mouth every morning. 30 capsule 0   amphetamine -dextroamphetamine  (ADDERALL ) 20 MG tablet Take 1 tablet (20 mg total) by mouth daily. 30 tablet 0   amphetamine -dextroamphetamine  (ADDERALL ) 20 MG tablet Take 1 tablet (20 mg total) by mouth daily. 30 tablet 0   amphetamine -dextroamphetamine  (ADDERALL ) 20 MG tablet Take 1 tablet (20 mg total) by mouth daily. 30 tablet 0   estradiol  (ESTRACE ) 1 MG tablet Take 1 tablet (1 mg total) by mouth daily. 60 tablet 6   progesterone  (  PROMETRIUM ) 100 MG capsule Take 1 capsule (100 mg total) by mouth daily. 60 capsule 6   tirzepatide (MOUNJARO) 5 MG/0.5ML Pen With B12     Vitamin D-Vitamin K (K2-D3 10,000) 10000-45 UNIT-MCG CAPS Take 1 Capful by mouth 3 (three) times a week.     amLODipine  (NORVASC ) 5 MG tablet Take 1 tablet (5 mg total) by mouth daily. (Patient not taking: Reported on 09/16/2023) 90 tablet 1   Multiple Vitamin (MULTIVITAMIN) tablet Take 1 tablet by mouth daily. (Patient not taking: Reported on 09/16/2023)     traZODone  (DESYREL ) 50 MG tablet Take 0.5-2 tablets (25-100 mg total) by mouth at bedtime as needed for sleep. (Patient not taking: Reported on 09/16/2023) 30 tablet 1   No facility-administered medications prior to visit.    Allergies  Allergen Reactions   Latex     Rash/broke out in mouth from latex bands    Review of Systems  Constitutional:  Positive for weight loss.  HENT:  Positive for tinnitus. Negative for congestion and hearing loss.   Eyes:  Negative for blurred vision.  Respiratory:  Negative for cough.   Cardiovascular:  Negative for leg swelling.  Gastrointestinal:  Negative for constipation and diarrhea.  Genitourinary:  Negative for dysuria and frequency.  Musculoskeletal:  Negative for joint pain and  myalgias.  Skin:  Negative for rash.  Neurological:  Positive for headaches (occasional).  Psychiatric/Behavioral:  Negative for depression. The patient is not nervous/anxious.        Objective:    Physical Exam   BP 120/85 (BP Location: Right Arm, Patient Position: Sitting, Cuff Size: Normal)   Pulse 86   Temp 98.6 F (37 C) (Oral)   Ht 5' 4.5 (1.638 m)   Wt 175 lb (79.4 kg)   BMI 29.57 kg/m  Wt Readings from Last 3 Encounters:  06/21/24 175 lb (79.4 kg)  10/15/22 201 lb 6.4 oz (91.4 kg)  09/22/22 202 lb (91.6 kg)   Physical Exam  Constitutional: She is oriented to person, place, and time. She appears well-developed and well-nourished. No distress.  HENT:  Head: Normocephalic and atraumatic.  Right Ear: Tympanic membrane and ear canal normal.  Left Ear: Tympanic membrane and ear canal normal.  Mouth/Throat: Oropharynx is clear and moist.  Eyes: Pupils are equal, round, and reactive to light. No scleral icterus.  Neck: Normal range of motion. No thyromegaly present.  Cardiovascular: Normal rate and regular rhythm.   No murmur heard. Pulmonary/Chest: Effort normal and breath sounds normal. No respiratory distress. He has no wheezes. She has no rales. She exhibits no tenderness.  Abdominal: Soft. Bowel sounds are normal. She exhibits no distension and no mass. There is no tenderness. There is no rebound and no guarding.  Musculoskeletal: She exhibits no edema.  Lymphadenopathy:    She has no cervical adenopathy.  Neurological: She is alert and oriented to person, place, and time. She has normal patellar reflexes. She exhibits normal muscle tone. Coordination normal.  Skin: Skin is warm and dry.  Psychiatric: She has a normal mood and affect. Her behavior is normal. Judgment and thought content normal.  Breast/Pelvic: deferred          Assessment & Plan:        Assessment & Plan:   Problem List Items Addressed This Visit       Unprioritized   Vitamin D  deficiency   On otc supplement.       Skin lesion   Relevant Orders   Ambulatory  referral to Dermatology   Preventative health care - Primary   Screening mammogram for malignant neoplasm of breast Screening mammogram last performed in August 2023, due for repeat screening. - Schedule mammogram.  Screening for malignant neoplasm of colon Colon cancer screening last performed in 2021, due for repeat screening. - Send colon cancer screening kit to home.  Shingles vaccination consideration Considering shingles vaccination due to previous mild episodes of shingles. Aware of potential post-vaccination malaise.      Hypertension   BP Readings from Last 3 Encounters:  06/21/24 120/85  10/15/22 (!) 135/97  09/22/22 111/89   BP stable without medication.      RESOLVED: GERD (gastroesophageal reflux disease)   Stable without medication.       ADHD   She is maintained on Adderall   which is rx'd by Psychiatry.       Other Visit Diagnoses       Breast cancer screening by mammogram       Relevant Orders   MM 3D SCREENING MAMMOGRAM BILATERAL BREAST     Screening for colon cancer       Relevant Orders   Cologuard       I have discontinued Armida L. Cosey's multivitamin, amLODipine , and traZODone . I am also having her maintain her (K2-D3 10,000), estradiol , progesterone , amphetamine -dextroamphetamine , amphetamine -dextroamphetamine , amphetamine -dextroamphetamine , amphetamine -dextroamphetamine , amphetamine -dextroamphetamine , amphetamine -dextroamphetamine , and tirzepatide.  No orders of the defined types were placed in this encounter.

## 2024-06-21 NOTE — Assessment & Plan Note (Signed)
 Screening mammogram for malignant neoplasm of breast Screening mammogram last performed in August 2023, due for repeat screening. - Schedule mammogram.  Screening for malignant neoplasm of colon Colon cancer screening last performed in 2021, due for repeat screening. - Send colon cancer screening kit to home.  Shingles vaccination consideration Considering shingles vaccination due to previous mild episodes of shingles. Aware of potential post-vaccination malaise.

## 2024-07-12 DIAGNOSIS — Z1211 Encounter for screening for malignant neoplasm of colon: Secondary | ICD-10-CM | POA: Diagnosis not present

## 2024-07-18 LAB — COLOGUARD: COLOGUARD: NEGATIVE

## 2024-07-19 ENCOUNTER — Ambulatory Visit: Payer: Self-pay | Admitting: Family

## 2024-07-19 ENCOUNTER — Encounter: Admitting: Family

## 2024-07-21 ENCOUNTER — Other Ambulatory Visit (HOSPITAL_BASED_OUTPATIENT_CLINIC_OR_DEPARTMENT_OTHER): Payer: Self-pay

## 2024-07-21 ENCOUNTER — Other Ambulatory Visit: Payer: Self-pay

## 2024-08-26 ENCOUNTER — Other Ambulatory Visit (HOSPITAL_BASED_OUTPATIENT_CLINIC_OR_DEPARTMENT_OTHER): Payer: Self-pay

## 2024-08-26 ENCOUNTER — Other Ambulatory Visit: Payer: Self-pay

## 2024-09-13 ENCOUNTER — Ambulatory Visit: Admitting: Physician Assistant

## 2024-09-27 ENCOUNTER — Inpatient Hospital Stay (HOSPITAL_BASED_OUTPATIENT_CLINIC_OR_DEPARTMENT_OTHER): Admission: RE | Admit: 2024-09-27

## 2024-10-11 ENCOUNTER — Ambulatory Visit: Admitting: Physician Assistant

## 2025-01-31 ENCOUNTER — Ambulatory Visit: Admitting: Physician Assistant
